# Patient Record
Sex: Female | Born: 1955 | Race: Asian | Hispanic: No | Marital: Married | Smoking: Never smoker
Health system: Southern US, Community
[De-identification: ages and names within clinical notes are randomized; demographics above are authoritative.]

## PROBLEM LIST (undated history)

## (undated) DIAGNOSIS — E7800 Pure hypercholesterolemia, unspecified: Secondary | ICD-10-CM

## (undated) DIAGNOSIS — E119 Type 2 diabetes mellitus without complications: Secondary | ICD-10-CM

## (undated) DIAGNOSIS — I10 Essential (primary) hypertension: Secondary | ICD-10-CM

---

## 2018-03-22 ENCOUNTER — Emergency Department (HOSPITAL_BASED_OUTPATIENT_CLINIC_OR_DEPARTMENT_OTHER): Payer: Commercial Managed Care - PPO

## 2018-03-22 ENCOUNTER — Inpatient Hospital Stay (HOSPITAL_BASED_OUTPATIENT_CLINIC_OR_DEPARTMENT_OTHER)
Admission: EM | Admit: 2018-03-22 | Discharge: 2018-03-24 | DRG: 854 | Disposition: A | Discharge status: Home / Self Care | Payer: Commercial Managed Care - PPO | Attending: Internal Medicine | Admitting: Internal Medicine

## 2018-03-22 ENCOUNTER — Encounter (HOSPITAL_BASED_OUTPATIENT_CLINIC_OR_DEPARTMENT_OTHER): Payer: Self-pay

## 2018-03-22 ENCOUNTER — Other Ambulatory Visit: Payer: Self-pay

## 2018-03-22 DIAGNOSIS — M542 Cervicalgia: Secondary | ICD-10-CM | POA: Diagnosis present

## 2018-03-22 DIAGNOSIS — Z7984 Long term (current) use of oral hypoglycemic drugs: Secondary | ICD-10-CM

## 2018-03-22 DIAGNOSIS — R6510 Systemic inflammatory response syndrome (SIRS) of non-infectious origin without acute organ dysfunction: Secondary | ICD-10-CM | POA: Diagnosis present

## 2018-03-22 DIAGNOSIS — K660 Peritoneal adhesions (postprocedural) (postinfection): Secondary | ICD-10-CM | POA: Diagnosis present

## 2018-03-22 DIAGNOSIS — E785 Hyperlipidemia, unspecified: Secondary | ICD-10-CM | POA: Diagnosis present

## 2018-03-22 DIAGNOSIS — A419 Sepsis, unspecified organism: Principal | ICD-10-CM | POA: Diagnosis present

## 2018-03-22 DIAGNOSIS — E1165 Type 2 diabetes mellitus with hyperglycemia: Secondary | ICD-10-CM | POA: Diagnosis present

## 2018-03-22 DIAGNOSIS — K828 Other specified diseases of gallbladder: Secondary | ICD-10-CM

## 2018-03-22 DIAGNOSIS — G8929 Other chronic pain: Secondary | ICD-10-CM | POA: Diagnosis present

## 2018-03-22 DIAGNOSIS — R109 Unspecified abdominal pain: Secondary | ICD-10-CM | POA: Diagnosis not present

## 2018-03-22 DIAGNOSIS — R1010 Upper abdominal pain, unspecified: Secondary | ICD-10-CM

## 2018-03-22 DIAGNOSIS — M79605 Pain in left leg: Secondary | ICD-10-CM | POA: Diagnosis present

## 2018-03-22 DIAGNOSIS — K819 Cholecystitis, unspecified: Secondary | ICD-10-CM | POA: Diagnosis present

## 2018-03-22 DIAGNOSIS — E1141 Type 2 diabetes mellitus with diabetic mononeuropathy: Secondary | ICD-10-CM | POA: Diagnosis present

## 2018-03-22 DIAGNOSIS — E669 Obesity, unspecified: Secondary | ICD-10-CM | POA: Diagnosis present

## 2018-03-22 DIAGNOSIS — R1013 Epigastric pain: Secondary | ICD-10-CM | POA: Diagnosis present

## 2018-03-22 DIAGNOSIS — I10 Essential (primary) hypertension: Secondary | ICD-10-CM | POA: Diagnosis present

## 2018-03-22 DIAGNOSIS — K8000 Calculus of gallbladder with acute cholecystitis without obstruction: Secondary | ICD-10-CM | POA: Diagnosis present

## 2018-03-22 DIAGNOSIS — I82441 Acute embolism and thrombosis of right tibial vein: Secondary | ICD-10-CM

## 2018-03-22 DIAGNOSIS — Z6837 Body mass index (BMI) 37.0-37.9, adult: Secondary | ICD-10-CM

## 2018-03-22 HISTORY — DX: Pure hypercholesterolemia, unspecified: E78.00

## 2018-03-22 HISTORY — DX: Essential (primary) hypertension: I10

## 2018-03-22 HISTORY — DX: Type 2 diabetes mellitus without complications: E11.9

## 2018-03-22 LAB — CBC WITH DIFFERENTIAL/PLATELET
BASOS PCT: 0 %
Basophils Absolute: 0 10*3/uL (ref 0.0–0.1)
EOS ABS: 0 10*3/uL (ref 0.0–0.7)
Eosinophils Relative: 0 %
HCT: 43.6 % (ref 36.0–46.0)
HEMOGLOBIN: 14.7 g/dL (ref 12.0–15.0)
Lymphocytes Relative: 7 %
Lymphs Abs: 1.3 10*3/uL (ref 0.7–4.0)
MCH: 25.6 pg — ABNORMAL LOW (ref 26.0–34.0)
MCHC: 33.7 g/dL (ref 30.0–36.0)
MCV: 76 fL — ABNORMAL LOW (ref 78.0–100.0)
Monocytes Absolute: 0.8 10*3/uL (ref 0.1–1.0)
Monocytes Relative: 4 %
NEUTROS PCT: 89 %
Neutro Abs: 15.7 10*3/uL — ABNORMAL HIGH (ref 1.7–7.7)
Platelets: 236 10*3/uL (ref 150–400)
RBC: 5.74 MIL/uL — AB (ref 3.87–5.11)
RDW: 13.8 % (ref 11.5–15.5)
WBC: 17.7 10*3/uL — AB (ref 4.0–10.5)

## 2018-03-22 LAB — COMPREHENSIVE METABOLIC PANEL
ALBUMIN: 3.8 g/dL (ref 3.5–5.0)
ALK PHOS: 80 U/L (ref 38–126)
ALT: 34 U/L (ref 0–44)
AST: 31 U/L (ref 15–41)
Anion gap: 12 (ref 5–15)
BUN: 11 mg/dL (ref 8–23)
CALCIUM: 8.6 mg/dL — AB (ref 8.9–10.3)
CO2: 25 mmol/L (ref 22–32)
CREATININE: 0.58 mg/dL (ref 0.44–1.00)
Chloride: 93 mmol/L — ABNORMAL LOW (ref 98–111)
GFR calc Af Amer: >60 mL/min (ref >60)
GFR calc non Af Amer: >60 mL/min (ref >60)
GLUCOSE: 255 mg/dL — AB (ref 70–99)
Potassium: 3.7 mmol/L (ref 3.5–5.1)
SODIUM: 130 mmol/L — AB (ref 135–145)
Total Bilirubin: 0.4 mg/dL (ref 0.3–1.2)
Total Protein: 7.1 g/dL (ref 6.5–8.1)

## 2018-03-22 LAB — LIPASE, BLOOD: Lipase: 24 U/L (ref 11–51)

## 2018-03-22 LAB — TROPONIN I: Troponin I: <0.03 ng/mL (ref <0.03)

## 2018-03-22 MED ORDER — METRONIDAZOLE IN NACL 5-0.79 MG/ML-% IV SOLN
500.0000 mg | Freq: Once | INTRAVENOUS | Status: AC
  Administered 2018-03-23: 500 mg via INTRAVENOUS
  Filled 2018-03-22: qty 100

## 2018-03-22 MED ORDER — IOPAMIDOL (ISOVUE-370) INJECTION 76%
100.0000 mL | Freq: Once | INTRAVENOUS | Status: AC | PRN
  Administered 2018-03-22: 100 mL via INTRAVENOUS

## 2018-03-22 MED ORDER — ALUM & MAG HYDROXIDE-SIMETH 200-200-20 MG/5ML PO SUSP
15.0000 mL | Freq: Once | ORAL | Status: AC
  Administered 2018-03-22: 15 mL via ORAL
  Filled 2018-03-22: qty 30

## 2018-03-22 MED ORDER — CEFTRIAXONE SODIUM 1 G IJ SOLR
1.0000 g | Freq: Once | INTRAMUSCULAR | Status: AC
  Administered 2018-03-23: 1 g via INTRAVENOUS
  Filled 2018-03-22: qty 10

## 2018-03-22 MED ORDER — LIDOCAINE VISCOUS HCL 2 % MT SOLN
15.0000 mL | Freq: Once | OROMUCOSAL | Status: AC
  Administered 2018-03-22: 15 mL via OROMUCOSAL
  Filled 2018-03-22: qty 15

## 2018-03-22 NOTE — ED Provider Notes (Signed)
[facility] EMERGENCY DEPARTMENT Provider Note   CSN: [id number] Arrival date & time: 03/22/18  1904     History   Chief Complaint Chief Complaint  Patient presents with  . Abdominal Pain    HPI Kathryn Christian is a 62 y.o. female.  History is being provided with translation assistance by her family.  The patient speaks primarily Urdu.  She has had upper abdominal and chest sharp stabbing pain since last evening.  She has never had this before.  It was not associated with any heartburn taste in her mouth.  It was not associate with any shortness of breath dizziness or diaphoresis.  She has more chronic pain in her neck but to do not seem to be associated.  They went to the primary care's office with another relative and had blood work and x-rays done.  It sounds like they sent her home with some metoclopramide and magnesium citrate.  She took the mag citrate at home and water and immediately vomited it and had worsening of her pain.  They brought her here for evaluation.  She has no known known history of cardiac disease.  She is been troubled by some pain in her legs over the past few months and infrequently takes NSAIDs.  The history is provided by the patient and a relative. The history is limited by a language barrier. A language interpreter was used (family).  Chest Pain   This is a new problem. The current episode started yesterday. The problem occurs constantly. The problem has not changed since onset.The pain is present in the substernal region. The pain is moderate. The quality of the pain is described as sharp. The pain does not radiate. Associated symptoms include abdominal pain, headaches, nausea and vomiting. Pertinent negatives include no fever and no shortness of breath. She has tried nothing for the symptoms. The treatment provided no relief.  Her past medical history is significant for diabetes, hyperlipidemia and hypertension.  Pertinent negatives for past medical  history include no CAD.    Past Medical History:  Diagnosis Date  . Diabetes mellitus without complication (HCC)   . High cholesterol   . Hypertension     There are no active problems to display for this patient.   History reviewed. No pertinent surgical history.   OB History   None      Home Medications    Prior to Admission medications   Not on File    Family History No family history on file.  Social History Social History   Tobacco Use  . Smoking status: Never Smoker  . Smokeless tobacco: Never Used  Substance Use Topics  . Alcohol use: Never    Frequency: Never  . Drug use: Never     Allergies   Patient has no known allergies.   Review of Systems Review of Systems  Constitutional: Negative for fever.  HENT: Negative for sore throat.   Eyes: Negative for visual disturbance.  Respiratory: Negative for shortness of breath.   Cardiovascular: Positive for chest pain.  Gastrointestinal: Positive for abdominal pain, nausea and vomiting. Negative for diarrhea.  Genitourinary: Negative for dysuria and frequency.  Musculoskeletal: Positive for myalgias and neck pain.  Skin: Negative for rash.  Neurological: Positive for headaches.     Physical Exam Updated Vital Signs BP (!) 167/83 (BP Location: Left Arm)   Pulse 80   Temp 98.9 F (37.2 C) (Oral)   Resp 20   Ht 5\' 1"  (1.549 m)  Wt 90.3 kg   SpO2 98%   BMI 37.60 kg/m   Physical Exam  Constitutional: She appears well-developed and well-nourished. No distress.  HENT:  Head: Normocephalic and atraumatic.  Eyes: Conjunctivae are normal.  Neck: Neck supple.  Cardiovascular: Normal rate, regular rhythm, normal heart sounds and intact distal pulses.  No murmur heard. Pulmonary/Chest: Effort normal and breath sounds normal. No respiratory distress.  Abdominal: Soft. There is tenderness (mild subxiphoid). There is no rigidity and no guarding.  Musculoskeletal: She exhibits edema (1+ bilateral)  and tenderness (lle). She exhibits no deformity.  Neurological: She is alert.  Skin: Skin is warm and dry.  Psychiatric: She has a normal mood and affect.  Nursing note and vitals reviewed.    ED Treatments / Results  Labs (all labs ordered are listed, but only abnormal results are displayed) Labs Reviewed  CBC WITH DIFFERENTIAL/PLATELET - Abnormal; Notable for the following components:      Result Value   WBC 17.7 (*)    RBC 5.74 (*)    MCV 76.0 (*)    MCH 25.6 (*)    Neutro Abs 15.7 (*)    All other components within normal limits  COMPREHENSIVE METABOLIC PANEL - Abnormal; Notable for the following components:   Sodium 130 (*)    Chloride 93 (*)    Glucose, Bld 255 (*)    Calcium 8.6 (*)    All other components within normal limits  TROPONIN I  LIPASE, BLOOD    EKG EKG Interpretation  Date/Time:  Wednesday March 22 2018 20:29:41 EDT Ventricular Rate:  82 PR Interval:  162 QRS Duration: 99 QT Interval:  387 QTC Calculation: 452 R Axis:   85 Text Interpretation:  Sinus rhythm Borderline right axis deviation Confirmed by Meridee ScoreButler, Trishna Cwik [phone]([id number]) on 03/22/2018 9:01:18 PM   Radiology Dg Chest 2 View  Result Date: 03/22/2018 CLINICAL DATA:  Chest pain EXAM: CHEST - 2 VIEW COMPARISON:  None. FINDINGS: Mildly low lung volume. No acute airspace disease or effusion. Normal heart size. No pneumothorax. IMPRESSION: No active cardiopulmonary disease.  Low lung volume. Electronically Signed   By: Jasmine PangKim  Fujinaga M.D.   On: 03/22/2018 21:36   Ct Angio Chest Pe W/cm &/or Wo Cm  Result Date: 03/22/2018 CLINICAL DATA:  Epigastric pain for 15 hours. Mid chest pain. Elevated white cell count. EXAM: CT ANGIOGRAPHY CHEST CT ABDOMEN AND PELVIS WITH CONTRAST TECHNIQUE: Multidetector CT imaging of the chest was performed using the standard protocol during bolus administration of intravenous contrast. Multiplanar CT image reconstructions and MIPs were obtained to evaluate the vascular anatomy.  Multidetector CT imaging of the abdomen and pelvis was performed using the standard protocol during bolus administration of intravenous contrast. CONTRAST:  100mL ISOVUE-370 IOPAMIDOL (ISOVUE-370) INJECTION 76% COMPARISON:  None. FINDINGS: CTA CHEST FINDINGS Cardiovascular: Motion artifact limits examination. There is good opacification of the central and segmental pulmonary arteries. No focal filling defects. No evidence of significant pulmonary embolus. Normal heart size. No pericardial effusion. Normal caliber thoracic aorta. No aortic dissection. Great vessel origins are patent. Mediastinum/Nodes: No significant lymphadenopathy in the chest. Esophagus is decompressed. No abnormal mediastinal gas or fluid collections. Lungs/Pleura: Motion artifact limits examination. There is no evidence of focal consolidation or significant airspace disease. No pleural effusions. No pneumothorax. Airways are patent. Musculoskeletal: Degenerative changes in the spine. No destructive bone lesions. Review of the MIP images confirms the above findings. CT ABDOMEN and PELVIS FINDINGS Hepatobiliary: No focal liver lesions. Homogeneous liver parenchymal pattern. The gallbladder  is mildly distended and there is suggestion of pericholecystic edema. No stones are identified but changes may represent acute cholecystitis. No bile duct dilatation. Pancreas: Unremarkable. No pancreatic ductal dilatation or surrounding inflammatory changes. Spleen: Normal in size without focal abnormality. Adrenals/Urinary Tract: No adrenal gland nodules. Renal nephrograms are symmetrical. Small cyst on the left kidney. No hydronephrosis or hydroureter. Bladder is unremarkable. Stomach/Bowel: Stomach, small bowel, and colon are not abnormally distended. Scattered stool throughout the colon. No wall thickening is appreciated. Appendix is normal. Vascular/Lymphatic: No significant vascular findings are present. No enlarged abdominal or pelvic lymph nodes.  Reproductive: Uterus and bilateral adnexa are unremarkable. Other: No abdominal wall hernia or abnormality. No abdominopelvic ascites. Musculoskeletal: No acute or significant osseous findings. Review of the MIP images confirms the above findings. IMPRESSION: 1. No evidence of significant pulmonary embolus. 2. No evidence of active pulmonary disease. 3. Distended gallbladder with suggestion of pericholecystic edema. This could indicate acute cholecystitis. Consider ultrasound for further evaluation. 4. No evidence of bowel obstruction or inflammation. Electronically Signed   By: Burman NievesWilliam  Stevens M.D.   On: 03/22/2018 23:36   Ct Abdomen Pelvis W Contrast  Result Date: 03/22/2018 CLINICAL DATA:  Epigastric pain for 15 hours. Mid chest pain. Elevated white cell count. EXAM: CT ANGIOGRAPHY CHEST CT ABDOMEN AND PELVIS WITH CONTRAST TECHNIQUE: Multidetector CT imaging of the chest was performed using the standard protocol during bolus administration of intravenous contrast. Multiplanar CT image reconstructions and MIPs were obtained to evaluate the vascular anatomy. Multidetector CT imaging of the abdomen and pelvis was performed using the standard protocol during bolus administration of intravenous contrast. CONTRAST:  100mL ISOVUE-370 IOPAMIDOL (ISOVUE-370) INJECTION 76% COMPARISON:  None. FINDINGS: CTA CHEST FINDINGS Cardiovascular: Motion artifact limits examination. There is good opacification of the central and segmental pulmonary arteries. No focal filling defects. No evidence of significant pulmonary embolus. Normal heart size. No pericardial effusion. Normal caliber thoracic aorta. No aortic dissection. Great vessel origins are patent. Mediastinum/Nodes: No significant lymphadenopathy in the chest. Esophagus is decompressed. No abnormal mediastinal gas or fluid collections. Lungs/Pleura: Motion artifact limits examination. There is no evidence of focal consolidation or significant airspace disease. No pleural  effusions. No pneumothorax. Airways are patent. Musculoskeletal: Degenerative changes in the spine. No destructive bone lesions. Review of the MIP images confirms the above findings. CT ABDOMEN and PELVIS FINDINGS Hepatobiliary: No focal liver lesions. Homogeneous liver parenchymal pattern. The gallbladder is mildly distended and there is suggestion of pericholecystic edema. No stones are identified but changes may represent acute cholecystitis. No bile duct dilatation. Pancreas: Unremarkable. No pancreatic ductal dilatation or surrounding inflammatory changes. Spleen: Normal in size without focal abnormality. Adrenals/Urinary Tract: No adrenal gland nodules. Renal nephrograms are symmetrical. Small cyst on the left kidney. No hydronephrosis or hydroureter. Bladder is unremarkable. Stomach/Bowel: Stomach, small bowel, and colon are not abnormally distended. Scattered stool throughout the colon. No wall thickening is appreciated. Appendix is normal. Vascular/Lymphatic: No significant vascular findings are present. No enlarged abdominal or pelvic lymph nodes. Reproductive: Uterus and bilateral adnexa are unremarkable. Other: No abdominal wall hernia or abnormality. No abdominopelvic ascites. Musculoskeletal: No acute or significant osseous findings. Review of the MIP images confirms the above findings. IMPRESSION: 1. No evidence of significant pulmonary embolus. 2. No evidence of active pulmonary disease. 3. Distended gallbladder with suggestion of pericholecystic edema. This could indicate acute cholecystitis. Consider ultrasound for further evaluation. 4. No evidence of bowel obstruction or inflammation. Electronically Signed   By: Marisa CyphersWilliam  Stevens M.D.  On: 03/22/2018 23:36    Procedures .Critical Care Performed by: Terrilee Files, MD Authorized by: Terrilee Files, MD   Critical care provider statement:    Critical care time (minutes):  45   Critical care was necessary to treat or prevent imminent  or life-threatening deterioration of the following conditions:  Sepsis   Critical care was time spent personally by me on the following activities:  Discussions with consultants, evaluation of patient's response to treatment, examination of patient, ordering and performing treatments and interventions, ordering and review of laboratory studies, ordering and review of radiographic studies, pulse oximetry, re-evaluation of patient's condition, obtaining history from patient or surrogate, review of old charts and development of treatment plan with patient or surrogate   I assumed direction of critical care for this patient from another provider in my specialty: no     (including critical care time)  Medications Ordered in ED Medications  lidocaine (XYLOCAINE) 2 % viscous mouth solution 15 mL (15 mLs Mouth/Throat Given 03/22/18 2217)  alum & mag hydroxide-simeth (MAALOX/MYLANTA) 200-200-20 MG/5ML suspension 15 mL (15 mLs Oral Given 03/22/18 2218)  iopamidol (ISOVUE-370) 76 % injection 100 mL (100 mLs Intravenous Contrast Given 03/22/18 2304)     Initial Impression / Assessment and Plan / ED Course  I have reviewed the triage vital signs and the nursing notes.  Pertinent labs & imaging results that were available during my care of the patient were reviewed by me and considered in my medical decision making (see chart for details).  Clinical Course as of Mar 24 1341  Wed Mar 22, 2018  2341 Patient symptoms improved here.  I ordered her a CT chest PE study and a CT abdomen.  There was no evidence of PE but on the abdomen they did see an enlarged gallbladder with question pericholecystic edema.  Ultrasound is not available at this time.  She does have an elevated white count but normal LFTs.   [MB]  2359 I consulted general surgery Dr. Carolynne Edouard.  He recommends the patient be admitted to medical service on IV antibiotics and they will evaluate her and she needs percutaneous drainage versus surgery.  I have  paged the hospitalist for admission.   [MB]  Thu Mar 23, 2018  0020 Discussed with Dr. Katrinka Blazing hospitalist triad at [facility] who accepts the patient for admission.   [MB]    Clinical Course User Index [MB] Terrilee Files, MD     Final Clinical Impressions(s) / ED Diagnoses   Final diagnoses:  Upper abdominal pain  Gallbladder dilatation  SIRS (systemic inflammatory response syndrome) [facility])    ED Discharge Orders    None       Terrilee Files, MD 03/24/18 1344

## 2018-03-22 NOTE — ED Triage Notes (Addendum)
Per son/interpreter-pt with epigastric pain started approx 7am-[facility] EKG and abd series-rx reglan and magnesium citrate-to triage in w/c-NAD

## 2018-03-22 NOTE — ED Notes (Signed)
Pt reports pain is unchanged after GI cocktail

## 2018-03-22 NOTE — ED Notes (Signed)
Per family, pt sts her pain has been in central chest, not abdomen.

## 2018-03-22 NOTE — ED Notes (Signed)
Patient transported to X-ray 

## 2018-03-22 NOTE — ED Notes (Signed)
Patient transported to CT 

## 2018-03-23 ENCOUNTER — Inpatient Hospital Stay (HOSPITAL_COMMUNITY): Payer: Commercial Managed Care - PPO

## 2018-03-23 ENCOUNTER — Encounter (HOSPITAL_COMMUNITY): Payer: Self-pay | Admitting: Internal Medicine

## 2018-03-23 ENCOUNTER — Encounter (HOSPITAL_COMMUNITY)
Admission: EM | Disposition: A | Discharge status: Home / Self Care | Payer: Self-pay | Source: Home / Self Care | Attending: Internal Medicine

## 2018-03-23 ENCOUNTER — Inpatient Hospital Stay (HOSPITAL_COMMUNITY): Payer: Commercial Managed Care - PPO | Admitting: Anesthesiology

## 2018-03-23 DIAGNOSIS — E1141 Type 2 diabetes mellitus with diabetic mononeuropathy: Secondary | ICD-10-CM | POA: Diagnosis present

## 2018-03-23 DIAGNOSIS — I82402 Acute embolism and thrombosis of unspecified deep veins of left lower extremity: Secondary | ICD-10-CM | POA: Diagnosis not present

## 2018-03-23 DIAGNOSIS — K828 Other specified diseases of gallbladder: Secondary | ICD-10-CM | POA: Diagnosis not present

## 2018-03-23 DIAGNOSIS — M79605 Pain in left leg: Secondary | ICD-10-CM | POA: Diagnosis not present

## 2018-03-23 DIAGNOSIS — A419 Sepsis, unspecified organism: Secondary | ICD-10-CM | POA: Diagnosis present

## 2018-03-23 DIAGNOSIS — R1010 Upper abdominal pain, unspecified: Secondary | ICD-10-CM | POA: Diagnosis not present

## 2018-03-23 DIAGNOSIS — K819 Cholecystitis, unspecified: Secondary | ICD-10-CM | POA: Diagnosis present

## 2018-03-23 DIAGNOSIS — Z6837 Body mass index (BMI) 37.0-37.9, adult: Secondary | ICD-10-CM | POA: Diagnosis not present

## 2018-03-23 DIAGNOSIS — K660 Peritoneal adhesions (postprocedural) (postinfection): Secondary | ICD-10-CM | POA: Diagnosis present

## 2018-03-23 DIAGNOSIS — M79609 Pain in unspecified limb: Secondary | ICD-10-CM | POA: Diagnosis not present

## 2018-03-23 DIAGNOSIS — R1013 Epigastric pain: Secondary | ICD-10-CM | POA: Diagnosis present

## 2018-03-23 DIAGNOSIS — I82441 Acute embolism and thrombosis of right tibial vein: Secondary | ICD-10-CM | POA: Diagnosis not present

## 2018-03-23 DIAGNOSIS — E785 Hyperlipidemia, unspecified: Secondary | ICD-10-CM | POA: Diagnosis present

## 2018-03-23 DIAGNOSIS — E669 Obesity, unspecified: Secondary | ICD-10-CM | POA: Diagnosis present

## 2018-03-23 DIAGNOSIS — K8000 Calculus of gallbladder with acute cholecystitis without obstruction: Secondary | ICD-10-CM | POA: Diagnosis present

## 2018-03-23 DIAGNOSIS — R6510 Systemic inflammatory response syndrome (SIRS) of non-infectious origin without acute organ dysfunction: Secondary | ICD-10-CM | POA: Diagnosis not present

## 2018-03-23 DIAGNOSIS — E1165 Type 2 diabetes mellitus with hyperglycemia: Secondary | ICD-10-CM | POA: Diagnosis present

## 2018-03-23 DIAGNOSIS — R109 Unspecified abdominal pain: Secondary | ICD-10-CM | POA: Diagnosis present

## 2018-03-23 DIAGNOSIS — I10 Essential (primary) hypertension: Secondary | ICD-10-CM | POA: Diagnosis present

## 2018-03-23 DIAGNOSIS — Z7984 Long term (current) use of oral hypoglycemic drugs: Secondary | ICD-10-CM | POA: Diagnosis not present

## 2018-03-23 DIAGNOSIS — M542 Cervicalgia: Secondary | ICD-10-CM | POA: Diagnosis present

## 2018-03-23 DIAGNOSIS — G8929 Other chronic pain: Secondary | ICD-10-CM | POA: Diagnosis present

## 2018-03-23 HISTORY — PX: CHOLECYSTECTOMY: SHX55

## 2018-03-23 LAB — GLUCOSE, CAPILLARY
GLUCOSE-CAPILLARY: 194 mg/dL — AB (ref 70–99)
Glucose-Capillary: 253 mg/dL — ABNORMAL HIGH (ref 70–99)
Glucose-Capillary: 253 mg/dL — ABNORMAL HIGH (ref 70–99)
Glucose-Capillary: 260 mg/dL — ABNORMAL HIGH (ref 70–99)
Glucose-Capillary: 258 mg/dL — ABNORMAL HIGH (ref 70–99)
Glucose-Capillary: 239 mg/dL — ABNORMAL HIGH (ref 70–99)

## 2018-03-23 LAB — COMPREHENSIVE METABOLIC PANEL
ALBUMIN: 3.2 g/dL — AB (ref 3.5–5.0)
ALT: 30 U/L (ref 0–44)
ANION GAP: 11 (ref 5–15)
AST: 25 U/L (ref 15–41)
Alkaline Phosphatase: 71 U/L (ref 38–126)
BUN: 9 mg/dL (ref 8–23)
CHLORIDE: 97 mmol/L — AB (ref 98–111)
CO2: 29 mmol/L (ref 22–32)
Calcium: 8.5 mg/dL — ABNORMAL LOW (ref 8.9–10.3)
Creatinine, Ser: 0.58 mg/dL (ref 0.44–1.00)
GFR calc Af Amer: >60 mL/min (ref >60)
GFR calc non Af Amer: >60 mL/min (ref >60)
GLUCOSE: 260 mg/dL — AB (ref 70–99)
POTASSIUM: 3.8 mmol/L (ref 3.5–5.1)
SODIUM: 137 mmol/L (ref 135–145)
Total Bilirubin: 0.6 mg/dL (ref 0.3–1.2)
Total Protein: 6.5 g/dL (ref 6.5–8.1)

## 2018-03-23 LAB — URINALYSIS, ROUTINE W REFLEX MICROSCOPIC
BILIRUBIN URINE: NEGATIVE
GLUCOSE, UA: 250 mg/dL — AB
Hgb urine dipstick: NEGATIVE
Ketones, ur: NEGATIVE mg/dL
Leukocytes, UA: NEGATIVE
Nitrite: NEGATIVE
PH: 7 (ref 5.0–8.0)
Protein, ur: NEGATIVE mg/dL

## 2018-03-23 LAB — LACTIC ACID, PLASMA
Lactic Acid, Venous: 1.5 mmol/L (ref 0.5–1.9)
Lactic Acid, Venous: 1.3 mmol/L (ref 0.5–1.9)

## 2018-03-23 LAB — CBC
HCT: 41.4 % (ref 36.0–46.0)
HEMOGLOBIN: 13.8 g/dL (ref 12.0–15.0)
MCH: 25.7 pg — ABNORMAL LOW (ref 26.0–34.0)
MCHC: 33.3 g/dL (ref 30.0–36.0)
MCV: 77.1 fL — ABNORMAL LOW (ref 78.0–100.0)
Platelets: 233 10*3/uL (ref 150–400)
RBC: 5.37 MIL/uL — ABNORMAL HIGH (ref 3.87–5.11)
RDW: 13.8 % (ref 11.5–15.5)
WBC: 13.9 10*3/uL — ABNORMAL HIGH (ref 4.0–10.5)

## 2018-03-23 LAB — SURGICAL PCR SCREEN
MRSA, PCR: NEGATIVE
Staphylococcus aureus: NEGATIVE

## 2018-03-23 LAB — HIV ANTIBODY (ROUTINE TESTING W REFLEX): HIV SCREEN 4TH GENERATION: NONREACTIVE

## 2018-03-23 LAB — D-DIMER, QUANTITATIVE (NOT AT ARMC): D DIMER QUANT: 0.69 ug{FEU}/mL — AB (ref 0.00–0.50)

## 2018-03-23 SURGERY — LAPAROSCOPIC CHOLECYSTECTOMY WITH INTRAOPERATIVE CHOLANGIOGRAM
Anesthesia: General | Site: Abdomen

## 2018-03-23 MED ORDER — ACETAMINOPHEN 10 MG/ML IV SOLN
INTRAVENOUS | Status: DC | PRN
  Administered 2018-03-23: 1000 mg via INTRAVENOUS

## 2018-03-23 MED ORDER — SODIUM CHLORIDE 0.9 % IV SOLN
INTRAVENOUS | Status: DC | PRN
  Administered 2018-03-23: 16 mL

## 2018-03-23 MED ORDER — INSULIN ASPART 100 UNIT/ML ~~LOC~~ SOLN
0.0000 [IU] | SUBCUTANEOUS | Status: DC
  Administered 2018-03-23: 5 [IU] via SUBCUTANEOUS

## 2018-03-23 MED ORDER — PROPOFOL 10 MG/ML IV BOLUS
INTRAVENOUS | Status: AC
  Filled 2018-03-23: qty 20

## 2018-03-23 MED ORDER — SUGAMMADEX SODIUM 200 MG/2ML IV SOLN
INTRAVENOUS | Status: DC | PRN
  Administered 2018-03-23: 200 mg via INTRAVENOUS

## 2018-03-23 MED ORDER — PHENYLEPHRINE 40 MCG/ML (10ML) SYRINGE FOR IV PUSH (FOR BLOOD PRESSURE SUPPORT)
PREFILLED_SYRINGE | INTRAVENOUS | Status: DC | PRN
  Administered 2018-03-23: 80 ug via INTRAVENOUS

## 2018-03-23 MED ORDER — KETOROLAC TROMETHAMINE 30 MG/ML IJ SOLN: 30.0000 mg | Freq: Once | INTRAMUSCULAR | Status: DC | PRN

## 2018-03-23 MED ORDER — LACTATED RINGERS IR SOLN
Status: DC | PRN
  Administered 2018-03-23: 1000 mL

## 2018-03-23 MED ORDER — INSULIN ASPART 100 UNIT/ML ~~LOC~~ SOLN
5.0000 [IU] | Freq: Once | SUBCUTANEOUS | Status: AC
  Administered 2018-03-23: 5 [IU] via SUBCUTANEOUS

## 2018-03-23 MED ORDER — ACETAMINOPHEN 10 MG/ML IV SOLN
INTRAVENOUS | Status: AC
  Filled 2018-03-23: qty 100

## 2018-03-23 MED ORDER — ACETAMINOPHEN 325 MG PO TABS: 650.0000 mg | ORAL_TABLET | Freq: Four times a day (QID) | ORAL | Status: DC | PRN

## 2018-03-23 MED ORDER — MORPHINE SULFATE (PF) 2 MG/ML IV SOLN
2.0000 mg | INTRAVENOUS | Status: DC | PRN
  Administered 2018-03-23: 2 mg via INTRAVENOUS
  Filled 2018-03-23: qty 1

## 2018-03-23 MED ORDER — HYDROMORPHONE HCL 1 MG/ML IJ SOLN: 0.2500 mg | INTRAMUSCULAR | Status: DC | PRN

## 2018-03-23 MED ORDER — ONDANSETRON 4 MG PO TBDP: 4.0000 mg | ORAL_TABLET | Freq: Four times a day (QID) | ORAL | Status: DC | PRN

## 2018-03-23 MED ORDER — TRAMADOL HCL 50 MG PO TABS: 50.0000 mg | ORAL_TABLET | Freq: Four times a day (QID) | ORAL | Status: DC | PRN

## 2018-03-23 MED ORDER — MEPERIDINE HCL 50 MG/ML IJ SOLN: 6.2500 mg | INTRAMUSCULAR | Status: DC | PRN

## 2018-03-23 MED ORDER — HYDROCODONE-ACETAMINOPHEN 5-325 MG PO TABS
1.0000 | ORAL_TABLET | ORAL | Status: DC | PRN
  Administered 2018-03-23: 1 via ORAL
  Filled 2018-03-23: qty 1

## 2018-03-23 MED ORDER — BUPIVACAINE-EPINEPHRINE (PF) 0.5% -1:200000 IJ SOLN
INTRAMUSCULAR | Status: AC
  Filled 2018-03-23: qty 30

## 2018-03-23 MED ORDER — ROCURONIUM BROMIDE 10 MG/ML (PF) SYRINGE
PREFILLED_SYRINGE | INTRAVENOUS | Status: DC | PRN
  Administered 2018-03-23: 50 mg via INTRAVENOUS

## 2018-03-23 MED ORDER — SODIUM CHLORIDE 0.9 % IV SOLN
INTRAVENOUS | Status: DC | PRN
  Administered 2018-03-23: via INTRAVENOUS

## 2018-03-23 MED ORDER — FENTANYL CITRATE (PF) 250 MCG/5ML IJ SOLN
INTRAMUSCULAR | Status: DC | PRN
  Administered 2018-03-23: 150 ug via INTRAVENOUS

## 2018-03-23 MED ORDER — MIDAZOLAM HCL 2 MG/2ML IJ SOLN
INTRAMUSCULAR | Status: AC
  Filled 2018-03-23: qty 2

## 2018-03-23 MED ORDER — PROPOFOL 10 MG/ML IV BOLUS
INTRAVENOUS | Status: DC | PRN
  Administered 2018-03-23: 140 mg via INTRAVENOUS

## 2018-03-23 MED ORDER — BUPIVACAINE-EPINEPHRINE 0.5% -1:200000 IJ SOLN
INTRAMUSCULAR | Status: DC | PRN
  Administered 2018-03-23: 30 mL

## 2018-03-23 MED ORDER — LACTATED RINGERS IV SOLN
INTRAVENOUS | Status: DC
  Administered 2018-03-23: 1000 mL via INTRAVENOUS
  Administered 2018-03-23: 14:00:00 via INTRAVENOUS

## 2018-03-23 MED ORDER — ONDANSETRON HCL 4 MG PO TABS: 4.0000 mg | ORAL_TABLET | Freq: Four times a day (QID) | ORAL | Status: DC | PRN

## 2018-03-23 MED ORDER — POTASSIUM CHLORIDE IN NACL 20-0.9 MEQ/L-% IV SOLN
INTRAVENOUS | Status: DC
  Administered 2018-03-23: 17:00:00 via INTRAVENOUS
  Filled 2018-03-23 (×2): qty 1000

## 2018-03-23 MED ORDER — SODIUM CHLORIDE 0.9 % IV SOLN
2.0000 g | INTRAVENOUS | Status: DC
  Filled 2018-03-23: qty 20

## 2018-03-23 MED ORDER — ONDANSETRON HCL 4 MG/2ML IJ SOLN: 4.0000 mg | Freq: Four times a day (QID) | INTRAMUSCULAR | Status: DC | PRN

## 2018-03-23 MED ORDER — ACETAMINOPHEN 650 MG RE SUPP: 650.0000 mg | Freq: Four times a day (QID) | RECTAL | Status: DC | PRN

## 2018-03-23 MED ORDER — METRONIDAZOLE IN NACL 5-0.79 MG/ML-% IV SOLN
500.0000 mg | Freq: Three times a day (TID) | INTRAVENOUS | Status: DC
  Administered 2018-03-23: 500 mg via INTRAVENOUS
  Filled 2018-03-23: qty 100

## 2018-03-23 MED ORDER — INSULIN ASPART 100 UNIT/ML ~~LOC~~ SOLN
0.0000 [IU] | Freq: Every day | SUBCUTANEOUS | Status: DC
  Administered 2018-03-23: 2 [IU] via SUBCUTANEOUS

## 2018-03-23 MED ORDER — SODIUM CHLORIDE 0.9 % IV SOLN
INTRAVENOUS | Status: DC
  Administered 2018-03-23: 03:00:00 via INTRAVENOUS

## 2018-03-23 MED ORDER — PROMETHAZINE HCL 25 MG/ML IJ SOLN: 6.2500 mg | INTRAMUSCULAR | Status: DC | PRN

## 2018-03-23 MED ORDER — IOPAMIDOL (ISOVUE-300) INJECTION 61%
INTRAVENOUS | Status: AC
  Filled 2018-03-23: qty 50

## 2018-03-23 MED ORDER — HYDROCHLOROTHIAZIDE 25 MG PO TABS
25.0000 mg | ORAL_TABLET | Freq: Every day | ORAL | Status: DC
  Administered 2018-03-23 – 2018-03-24 (×2): 25 mg via ORAL
  Filled 2018-03-23 (×2): qty 1

## 2018-03-23 MED ORDER — ENOXAPARIN SODIUM 40 MG/0.4ML ~~LOC~~ SOLN
40.0000 mg | SUBCUTANEOUS | Status: DC
  Filled 2018-03-23: qty 0.4

## 2018-03-23 MED ORDER — ONDANSETRON HCL 4 MG/2ML IJ SOLN
INTRAMUSCULAR | Status: AC
  Filled 2018-03-23: qty 2

## 2018-03-23 MED ORDER — DEXAMETHASONE SODIUM PHOSPHATE 10 MG/ML IJ SOLN
INTRAMUSCULAR | Status: AC
  Filled 2018-03-23: qty 1

## 2018-03-23 MED ORDER — DEXAMETHASONE SODIUM PHOSPHATE 10 MG/ML IJ SOLN
INTRAMUSCULAR | Status: DC | PRN
  Administered 2018-03-23: 10 mg via INTRAVENOUS

## 2018-03-23 MED ORDER — HYDROMORPHONE HCL 1 MG/ML IJ SOLN: 1.0000 mg | INTRAMUSCULAR | Status: DC | PRN

## 2018-03-23 MED ORDER — CEFTRIAXONE SODIUM 2 G IJ SOLR
2.0000 g | INTRAMUSCULAR | Status: DC
  Administered 2018-03-23: 2 g via INTRAVENOUS
  Filled 2018-03-23: qty 2

## 2018-03-23 MED ORDER — ROCURONIUM BROMIDE 10 MG/ML (PF) SYRINGE
PREFILLED_SYRINGE | INTRAVENOUS | Status: AC
  Filled 2018-03-23: qty 10

## 2018-03-23 MED ORDER — ONDANSETRON HCL 4 MG/2ML IJ SOLN
INTRAMUSCULAR | Status: DC | PRN
  Administered 2018-03-23: 4 mg via INTRAVENOUS

## 2018-03-23 MED ORDER — LIDOCAINE 2% (20 MG/ML) 5 ML SYRINGE
INTRAMUSCULAR | Status: DC | PRN
  Administered 2018-03-23: 100 mg via INTRAVENOUS

## 2018-03-23 MED ORDER — INSULIN ASPART 100 UNIT/ML ~~LOC~~ SOLN
0.0000 [IU] | Freq: Three times a day (TID) | SUBCUTANEOUS | Status: DC
  Administered 2018-03-23: 5 [IU] via SUBCUTANEOUS
  Administered 2018-03-24: 7 [IU] via SUBCUTANEOUS
  Administered 2018-03-24: 3 [IU] via SUBCUTANEOUS

## 2018-03-23 MED ORDER — SODIUM CHLORIDE 0.9% FLUSH: 3.0000 mL | Freq: Two times a day (BID) | INTRAVENOUS | Status: DC

## 2018-03-23 MED ORDER — IOPAMIDOL (ISOVUE-300) INJECTION 61%: INTRAVENOUS | Status: DC | PRN

## 2018-03-23 MED ORDER — SUGAMMADEX SODIUM 200 MG/2ML IV SOLN
INTRAVENOUS | Status: AC
  Filled 2018-03-23: qty 2

## 2018-03-23 MED ORDER — ACETAMINOPHEN 325 MG PO TABS
650.0000 mg | ORAL_TABLET | Freq: Once | ORAL | Status: AC
  Administered 2018-03-23: 650 mg via ORAL
  Filled 2018-03-23: qty 2

## 2018-03-23 MED ORDER — LIDOCAINE 2% (20 MG/ML) 5 ML SYRINGE
INTRAMUSCULAR | Status: AC
  Filled 2018-03-23: qty 5

## 2018-03-23 MED ORDER — FENTANYL CITRATE (PF) 250 MCG/5ML IJ SOLN
INTRAMUSCULAR | Status: AC
  Filled 2018-03-23: qty 5

## 2018-03-23 SURGICAL SUPPLY — 31 items
APPLIER CLIP ROT 10 11.4 M/L (STAPLE) ×3
CABLE HIGH FREQUENCY MONO STRZ (ELECTRODE) ×3 IMPLANT
CHLORAPREP W/TINT 26ML (MISCELLANEOUS) ×3 IMPLANT
CLIP APPLIE ROT 10 11.4 M/L (STAPLE) ×1 IMPLANT
CLOSURE WOUND 1/2 X4 (GAUZE/BANDAGES/DRESSINGS) ×1
COVER MAYO STAND STRL (DRAPES) ×3 IMPLANT
COVER SURGICAL LIGHT HANDLE (MISCELLANEOUS) ×3 IMPLANT
DECANTER SPIKE VIAL GLASS SM (MISCELLANEOUS) ×3 IMPLANT
DRAPE C-ARM 42X120 X-RAY (DRAPES) ×3 IMPLANT
ELECT REM PT RETURN 15FT ADLT (MISCELLANEOUS) ×3 IMPLANT
GAUZE SPONGE 2X2 8PLY STRL LF (GAUZE/BANDAGES/DRESSINGS) ×1 IMPLANT
GLOVE SURG ORTHO 8.0 STRL STRW (GLOVE) ×3 IMPLANT
GOWN STRL REUS W/TWL XL LVL3 (GOWN DISPOSABLE) ×6 IMPLANT
HEMOSTAT SURGICEL 4X8 (HEMOSTASIS) IMPLANT
KIT BASIN OR (CUSTOM PROCEDURE TRAY) ×3 IMPLANT
POUCH SPECIMEN RETRIEVAL 10MM (ENDOMECHANICALS) ×3 IMPLANT
SCISSORS LAP 5X35 DISP (ENDOMECHANICALS) ×3 IMPLANT
SET CHOLANGIOGRAPH MIX (MISCELLANEOUS) ×3 IMPLANT
SET IRRIG TUBING LAPAROSCOPIC (IRRIGATION / IRRIGATOR) ×3 IMPLANT
SLEEVE XCEL OPT CAN 5 100 (ENDOMECHANICALS) ×3 IMPLANT
SPONGE GAUZE 2X2 STER 10/PKG (GAUZE/BANDAGES/DRESSINGS) ×2
STRIP CLOSURE SKIN 1/2X4 (GAUZE/BANDAGES/DRESSINGS) ×2 IMPLANT
SUT MNCRL AB 4-0 PS2 18 (SUTURE) ×6 IMPLANT
TAPE CLOTH SURG 4X10 WHT LF (GAUZE/BANDAGES/DRESSINGS) ×3 IMPLANT
TOWEL OR 17X26 10 PK STRL BLUE (TOWEL DISPOSABLE) ×3 IMPLANT
TOWEL OR NON WOVEN STRL DISP B (DISPOSABLE) ×3 IMPLANT
TRAY LAPAROSCOPIC (CUSTOM PROCEDURE TRAY) ×3 IMPLANT
TROCAR BLADELESS OPT 5 100 (ENDOMECHANICALS) ×3 IMPLANT
TROCAR XCEL BLUNT TIP 100MML (ENDOMECHANICALS) ×3 IMPLANT
TROCAR XCEL NON-BLD 11X100MML (ENDOMECHANICALS) ×3 IMPLANT
TUBING INSUF HEATED (TUBING) IMPLANT

## 2018-03-23 NOTE — Anesthesia Preprocedure Evaluation (Addendum)
Anesthesia Evaluation  Patient identified by MRN, date of birth, ID band Patient awake    Reviewed: Allergy & Precautions, NPO status , Patient's Chart, lab work & pertinent test results  Airway Mallampati: II  TM Distance: >3 FB Neck ROM: Full    Dental no notable dental hx. (+) Teeth Intact   Pulmonary    Pulmonary exam normal breath sounds clear to auscultation       Cardiovascular hypertension, Pt. on medications Normal cardiovascular exam Rhythm:Regular Rate:Normal     Neuro/Psych negative neurological ROS  negative psych ROS   GI/Hepatic negative GI ROS, Neg liver ROS,   Endo/Other  diabetes, Type 2, Oral Hypoglycemic Agents  Renal/GU negative Renal ROS     Musculoskeletal negative musculoskeletal ROS (+)   Abdominal Normal abdominal exam  (+) + obese,   Peds  Hematology negative hematology ROS (+)   Anesthesia Other Findings   Reproductive/Obstetrics negative OB ROS                            Anesthesia Physical Anesthesia Plan  ASA: II  Anesthesia Plan: General   Post-op Pain Management:    Induction: Intravenous  PONV Risk Score and Plan:   Airway Management Planned: Oral ETT  Additional Equipment:   Intra-op Plan:   Post-operative Plan: Extubation in OR  Informed Consent: I have reviewed the patients History and Physical, chart, labs and discussed the procedure including the risks, benefits and alternatives for the proposed anesthesia with the patient or authorized representative who has indicated his/her understanding and acceptance.   Dental advisory given  Plan Discussed with: CRNA and Surgeon  Anesthesia Plan Comments:         Anesthesia Quick Evaluation

## 2018-03-23 NOTE — Plan of Care (Signed)
[facility]  Ms. Welton FlakesKhan is a 62 year old Urdu speaking female with pmh of HTN, HLD, and DM type II; who presented with epigastric pain.  Vital signs revealed temperature 100.8 F, pulse 74-101, respirations 17-24, blood pressure 148/72-160 7/83, and O2 saturation 90-98%.  Labs revealed WBC 17.7, sodium 130, chloride 93, glucose 255,  troponin <0.03, and LFTs within normal limits.  CT abdomen pelvis revealed distended gallbladder with pericholecystic edema concerning for possible acute cholecystitis.  Dr. Chevis PrettyPaul Toth general surgery consulted and [facility].  Patient was empirically treated with ceftriaxone and metronidazole.  [facility] called to admit.  Accepted as inpatient to a telemetry bed.

## 2018-03-23 NOTE — Anesthesia Postprocedure Evaluation (Signed)
Anesthesia Post Note  Patient: Janae BridgemanSughra Vanderploeg  Procedure(s) Performed: LAPAROSCOPIC CHOLECYSTECTOMY WITH INTRAOPERATIVE CHOLANGIOGRAM (N/A Abdomen)     Patient location during evaluation: PACU Anesthesia Type: General Level of consciousness: awake and alert and oriented Pain management: pain level controlled Vital Signs Assessment: post-procedure vital signs reviewed and stable Respiratory status: spontaneous breathing, nonlabored ventilation and respiratory function stable Cardiovascular status: blood pressure returned to baseline and stable Postop Assessment: no apparent nausea or vomiting Anesthetic complications: no    Last Vitals:  Vitals:   03/23/18 1500 03/23/18 1515  BP: 130/72 125/73  Pulse: 97 96  Resp: 16 18  Temp:  37.2 C  SpO2: 99% 98%    Last Pain:  Vitals:   03/23/18 1515  TempSrc:   PainSc: 0-No pain                 Everlynn Sagun A.

## 2018-03-23 NOTE — Progress Notes (Signed)
PROGRESS NOTE    Kathryn BridgemanSughra Christian  UJW:[id number] DOB: 05/17/1956 DOA: 03/22/2018 PCP: [facility]    Brief Narrative:  62 year old female who presented with epigastric abdominal pain.  She does have significant past medical history for hypertension, dyslipidemia and type 2 diabetes mellitus.  Reported acute epigastric abdominal pain, sudden in onset, sharp in nature, radiated to her chest, and associated with vomiting.  On the initial physical examination temperature 100.8, heart rate 74-101, respiratory rate 17-24, blood pressure 148/72, oxygen saturation 90 to 98%.  Moist mucous membranes, lungs clear to auscultation, heart S1-S2 present and rhythmic, abdomen with right upper quadrant tenderness to deep palpation, no masses, no rebound or guarding, positive left lower extremity edema, nonpitting.  Sodium 130, potassium 3.7, chloride 93, bicarb 25, glucose 255, BUN 11, creatinine 0.58, AST 31, ALT 34, lipase 24, total bilirubin 0.4, white count 17.7, hemoglobin 14.7, hematocrit 43.6, platelets 236, urinalysis specific gravity less than 1.005, CT of the chest and abdomen, no pulmonary embolism, distended gallbladder with suggestion of pericholecystic edema.  Chest x-ray negative for infiltrates.  EKG normal sinus rhythm, normal axis, normal intervals.  Patient was admitted to the hospital with the working diagnosis of sepsis due to acute cholecystitis.  Assessment & Plan:   Principal Problem:   Epigastric abdominal pain Active Problems:   Cholecystitis   Left leg pain   Obesity (BMI 30-39.9)   SIRS (systemic inflammatory response syndrome) (HCC)   1. Sepsis due to acute cholecystitis. Will continue IV fluids with isotonic saline at 75 ml per hour, will continue antibiotic therapy with IV ceftriaxone and metronidazole. Scheduled for cholecystectomy today. Will continue to follow on cell count, temperature curve and cultures.   2. T2DM. Patient with nothing by mouth in preparation for  surgery, will continue capillary glucose monitoring with insulin sliding scale, will calculate requirements before starting basal insulin, capillary glucose 253 and 194.   3. Left lower extremity edema. Chronic edema, per patient's family had negative US doppler as outpatient over last month, will follow on repeat study ordered on admission.   4. HTN. Will continue blood pressure monitoring, currently not on medications.   5. Obesity. Calculated BMI 37   DVT prophylaxis: scd   Code Status: full Family Communication: I spoke with patient's daughter at the bedside and all questions were addressed.  Disposition Plan/ discharge barriers: pending surgical procedure and post op care.    Consultants:   Surgery   Procedures:     Antimicrobials:   ceftriaxone IV   metronidazole IV     Subjective: Patient with persistent abdominal pain, moderate in intensity, with no radiation, no associated nausea or vomiting, improved with analgesics. Chronic lower extremity edema on the right, had recent negative doppler US as outpatient.   Objective: Vitals:   03/23/18 0224 03/23/18 0228 03/23/18 0703 03/23/18 1103  BP:  132/69 137/72 (!) 145/81  Pulse:  93 88 (!) 101  Resp:  18 16 16   Temp:  98 F (36.7 C) 98.8 F (37.1 C) 100.2 F (37.9 C)  TempSrc:    Oral  SpO2:  96% 94% 94%  Weight: 89.5 kg     Height:        Intake/Output Summary (Last 24 hours) at 03/23/2018 1114 Last data filed at 03/23/2018 1000 Gross per 24 hour  Intake 702.32 ml  Output -  Net 702.32 ml   Filed Weights   03/22/18 1925 03/23/18 0224  Weight: 90.3 kg 89.5 kg    Examination:  General: Not in pain or dyspnea, deconditioned  Neurology: Awake and alert, non focal  E ENT: no pallor, no icterus, oral mucosa moist Cardiovascular: No JVD. S1-S2 present, rhythmic, no gallops, rubs, or murmurs. Left  lower extremity edema, non pitting ++/+++ Pulmonary: vesicular breath sounds bilaterally, adequate air  movement, no wheezing, rhonchi or rales. Gastrointestinal. Abdomen distended, no organomegaly, tender to deep palpation but no rebound or guarding Skin. No rashes Musculoskeletal: no joint deformities     Data Reviewed: I have personally reviewed following labs and imaging studies  CBC: Recent Labs  Lab 03/22/18 2054 03/23/18 0536  WBC 17.7* 13.9*  NEUTROABS 15.7*  --   HGB 14.7 13.8  HCT 43.6 41.4  MCV 76.0* 77.1*  PLT 236 233   Basic Metabolic Panel: Recent Labs  Lab 03/22/18 2116 03/23/18 0536  NA 130* 137  K 3.7 3.8  CL 93* 97*  CO2 25 29  GLUCOSE 255* 260*  BUN 11 9  CREATININE 0.58 0.58  CALCIUM 8.6* 8.5*   GFR: Estimated Creatinine Clearance: 74.2 mL/min (by C-G formula based on SCr of 0.58 mg/dL). Liver Function Tests: Recent Labs  Lab 03/22/18 2116 03/23/18 0536  AST 31 25  ALT 34 30  ALKPHOS 80 71  BILITOT 0.4 0.6  PROT 7.1 6.5  ALBUMIN 3.8 3.2*   Recent Labs  Lab 03/22/18 2116  LIPASE 24   No results for input(s): AMMONIA in the last 168 hours. Coagulation Profile: No results for input(s): INR, PROTIME in the last 168 hours. Cardiac Enzymes: Recent Labs  Lab 03/22/18 2054  TROPONINI <0.03   BNP (last 3 results) No results for input(s): PROBNP in the last 8760 hours. HbA1C: No results for input(s): HGBA1C in the last 72 hours. CBG: Recent Labs  Lab 03/23/18 0753 03/23/18 1109  GLUCAP 253* 194*   Lipid Profile: No results for input(s): CHOL, HDL, LDLCALC, TRIG, CHOLHDL, LDLDIRECT in the last 72 hours. Thyroid Function Tests: No results for input(s): TSH, T4TOTAL, FREET4, T3FREE, THYROIDAB in the last 72 hours. Anemia Panel: No results for input(s): VITAMINB12, FOLATE, FERRITIN, TIBC, IRON, RETICCTPCT in the last 72 hours.    Radiology Studies: I have reviewed all of the imaging during this hospital visit personally     Scheduled Meds: . [MAR Hold] enoxaparin (LOVENOX) injection  40 mg Subcutaneous Q24H  . [MAR Hold]  insulin aspart  0-9 Units Subcutaneous Q4H  . [MAR Hold] sodium chloride flush  3 mL Intravenous Q12H   Continuous Infusions: . [MAR Hold] sodium chloride Stopped (03/23/18 0215)  . sodium chloride Stopped (03/23/18 0820)  . [MAR Hold] cefTRIAXone (ROCEPHIN)  IV Stopped (03/23/18 0834)  . [MAR Hold] metronidazole Stopped (03/23/18 0754)     LOS: 0 days        Evie Croston Annett Gulaaniel Kollin Udell, MD [facility] Pager [phone]

## 2018-03-23 NOTE — Consult Note (Addendum)
Reason for Consult: cholelithiasis/cholecystitis CC:  Epigastric pain Referring Physician: DR. Jerilynn Mages Arrrien  Kathryn Christian is an 62 y.o. female.   HPI: 62 y/o with abdominal/chest pain who speaks Urdu. Family did the translation for the staff.  She has chronic neck pain.  Her son Kathryn Christian is with her and translating for [place].  He says the pain started Tuesday, 03/21/2018 in the evening.  She got better and she went to work Wednesday, but by noon yesterday the pain was so severe she had to go home.  This was discussed with [facility] medical and it she was given some mag citrate which she vomited.  She was taken to the [place] where she underwent work-up below.  She had some chills at home, and fever since admission.  She continues to be febrile and her pain which was midepigastric and chest is now primarily in her right upper quadrant.  Work up in the ED shows low grade fever, vital signs are stable.  Labs last p.m. at 2100 hrs, shows a glucose of 255, sodium of 130, LFTs are normal.  Lipase is 31.  CBC is 17.5, hemoglobin 14.7, hematocrit 43.6, platelets 236,000.  Repeat labs this a.m. again showed the LFTs are normal, glucose is 260, sodium is up to 137.  WBC improved at 13.9, H/H: 13.8/41.4, platelets 233,000.  D-dimer 0.69.  Urinalysis was normal.  Admission chest x-ray shows low lung volume, but no active disease.  CT of the chest showed no evidence of pulmonary embolus.  No evidence of active pulmonary disease.  CT of the abdomen the pelvis shows a distended gallbladder and possible pericholecystic edema.  Abdominal ultrasound obtained around 3:15 AM: Shows cholelithiasis with gallbladder sludge, gallbladder neck stone measuring 1.3 cm, mild peri-cholecystic edema without significant wall thickening, common bile duct was 4.2 mm.  Changes were nonspecific but indicative of acute cholecystitis.  Patient also had diffuse fatty infiltration of the liver.  We are asked to see.  Past Medical  History:  Diagnosis Date  . Diabetes mellitus without complication ([place]) she is currently on Glucophage, she has been out 1 week.  She is taking this intermittently and recently restarted with some lower extremity neuropathy.   . High cholesterol   . Hypertension     History reviewed. No pertinent surgical history. No prior surgeries.  History reviewed. No pertinent family history.  Social History:  reports that she has never smoked. She has never used smokeless tobacco. She reports that she does not drink alcohol or use drugs. EtOH: None Drugs: None Tobacco: None. She works in a Special educational needs teacher.  Allergies: No Known Allergies  Medications:  Prior to Admission:  Medications Prior to Admission  Medication Sig Dispense Refill Last Dose  . metFORMIN (GLUCOPHAGE-XR) 500 MG 24 hr tablet Take 500 mg by mouth daily.   Past Week at Unknown time   Scheduled: . enoxaparin (LOVENOX) injection  40 mg Subcutaneous Q24H  . insulin aspart  0-9 Units Subcutaneous Q4H  . sodium chloride flush  3 mL Intravenous Q12H   Continuous: . sodium chloride Stopped (03/23/18 0215)  . sodium chloride 75 mL/hr at 03/23/18 0600  . cefTRIAXone (ROCEPHIN)  IV    . metronidazole 500 mg (03/23/18 0654)   Anti-infectives (From admission, onward)   Start     Dose/Rate Route Frequency Ordered Stop   03/23/18 1000  cefTRIAXone (ROCEPHIN) 2 g in sodium chloride 0.9 % 100 mL IVPB     2 g 200  mL/hr over 30 Minutes Intravenous Every 24 hours 03/23/18 0223     03/23/18 0600  metroNIDAZOLE (FLAGYL) IVPB 500 mg     500 mg 100 mL/hr over 60 Minutes Intravenous Every 8 hours 03/23/18 0223     03/23/18 0000  cefTRIAXone (ROCEPHIN) 1 g in sodium chloride 0.9 % 100 mL IVPB     1 g 200 mL/hr over 30 Minutes Intravenous  Once 03/22/18 2359 03/23/18 0048   03/23/18 0000  metroNIDAZOLE (FLAGYL) IVPB 500 mg     500 mg 100 mL/hr over 60 Minutes Intravenous  Once 03/22/18 2359 03/23/18 0203      Results for orders placed  or performed during the hospital encounter of 03/22/18 (from the past 48 hour(s))  Troponin I     Status: None   Collection Time: 03/22/18  8:54 PM  Result Value Ref Range   Troponin I <0.03 <0.03 ng/mL    Comment: Performed at [facility], [address] [place], [place] [address]  CBC with Differential     Status: Abnormal   Collection Time: 03/22/18  8:54 PM  Result Value Ref Range   WBC 17.7 (H) 4.0 - 10.5 K/uL   RBC 5.74 (H) 3.87 - 5.11 MIL/uL   Hemoglobin 14.7 12.0 - 15.0 g/dL   HCT 43.6 36.0 - 46.0 %   MCV 76.0 (L) 78.0 - 100.0 fL   MCH 25.6 (L) 26.0 - 34.0 pg   MCHC 33.7 30.0 - 36.0 g/dL   RDW 13.8 11.5 - 15.5 %   Platelets 236 150 - 400 K/uL   Neutrophils Relative % 89 %   Neutro Abs 15.7 (H) 1.7 - 7.7 K/uL   Lymphocytes Relative 7 %   Lymphs Abs 1.3 0.7 - 4.0 K/uL   Monocytes Relative 4 %   Monocytes Absolute 0.8 0.1 - 1.0 K/uL   Eosinophils Relative 0 %   Eosinophils Absolute 0.0 0.0 - 0.7 K/uL   Basophils Relative 0 %   Basophils Absolute 0.0 0.0 - 0.1 K/uL    Comment: Performed at [facility], [address] [place], [place] [address]  Comprehensive metabolic panel     Status: Abnormal   Collection Time: 03/22/18  9:16 PM  Result Value Ref Range   Sodium 130 (L) 135 - 145 mmol/L   Potassium 3.7 3.5 - 5.1 mmol/L   Chloride 93 (L) 98 - 111 mmol/L   CO2 25 22 - 32 mmol/L   Glucose, Bld 255 (H) 70 - 99 mg/dL   BUN 11 8 - 23 mg/dL   Creatinine, Ser 0.58 0.44 - 1.00 mg/dL   Calcium 8.6 (L) 8.9 - 10.3 mg/dL   Total Protein 7.1 6.5 - 8.1 g/dL   Albumin 3.8 3.5 - 5.0 g/dL   AST 31 15 - 41 U/L   ALT 34 0 - 44 U/L   Alkaline Phosphatase 80 38 - 126 U/L   Total Bilirubin 0.4 0.3 - 1.2 mg/dL   GFR calc non Af Amer >60 >60 mL/min   GFR calc Af Amer >60 >60 mL/min    Comment: (NOTE) The eGFR has been calculated using the CKD EPI equation. This calculation has not been validated in all clinical situations. eGFR's persistently <60 mL/min  signify possible Chronic Kidney Disease.    Anion gap 12 5 - 15    Comment: Performed at [facility], [address] [place], [place] [address]  Lipase, blood  Status: None   Collection Time: 03/22/18  9:16 PM  Result Value Ref Range   Lipase 24 11 - 51 U/L    Comment: Performed at [facility], [address] [place], [place] [address]  Urinalysis, Routine w reflex microscopic     Status: Abnormal   Collection Time: 03/23/18 12:45 AM  Result Value Ref Range   Color, Urine YELLOW YELLOW   APPearance CLEAR CLEAR   Specific Gravity, Urine <1.005 (L) 1.005 - 1.030   pH 7.0 5.0 - 8.0   Glucose, UA 250 (A) NEGATIVE mg/dL   Hgb urine dipstick NEGATIVE NEGATIVE   Bilirubin Urine NEGATIVE NEGATIVE   Ketones, ur NEGATIVE NEGATIVE mg/dL   Protein, ur NEGATIVE NEGATIVE mg/dL   Nitrite NEGATIVE NEGATIVE   Leukocytes, UA NEGATIVE NEGATIVE    Comment: Microscopic not done on urines with negative protein, blood, leukocytes, nitrite, or glucose < 500 mg/dL. Performed at [facility], [address] [place], [place] [address]   Lactic acid, plasma     Status: None   Collection Time: 03/23/18  2:48 AM  Result Value Ref Range   Lactic Acid, Venous 1.5 0.5 - 1.9 mmol/L    Comment: Performed at [facility], [place] [address], [place], [place] [address]  CBC     Status: Abnormal   Collection Time: 03/23/18  5:36 AM  Result Value Ref Range   WBC 13.9 (H) 4.0 - 10.5 K/uL   RBC 5.37 (H) 3.87 - 5.11 MIL/uL   Hemoglobin 13.8 12.0 - 15.0 g/dL   HCT 41.4 36.0 - 46.0 %   MCV 77.1 (L) 78.0 - 100.0 fL   MCH 25.7 (L) 26.0 - 34.0 pg   MCHC 33.3 30.0 - 36.0 g/dL   RDW 13.8 11.5 - 15.5 %   Platelets 233 150 - 400 K/uL    Comment: Performed at [facility], [place] [address] [place], [place] [address]  Comprehensive metabolic panel     Status: Abnormal   Collection Time: 03/23/18  5:36 AM  Result Value Ref Range   Sodium 137 135 -  145 mmol/L   Potassium 3.8 3.5 - 5.1 mmol/L   Chloride 97 (L) 98 - 111 mmol/L   CO2 29 22 - 32 mmol/L   Glucose, Bld 260 (H) 70 - 99 mg/dL   BUN 9 8 - 23 mg/dL   Creatinine, Ser 0.58 0.44 - 1.00 mg/dL   Calcium 8.5 (L) 8.9 - 10.3 mg/dL   Total Protein 6.5 6.5 - 8.1 g/dL   Albumin 3.2 (L) 3.5 - 5.0 g/dL   AST 25 15 - 41 U/L   ALT 30 0 - 44 U/L   Alkaline Phosphatase 71 38 - 126 U/L   Total Bilirubin 0.6 0.3 - 1.2 mg/dL   GFR calc non Af Amer >60 >60 mL/min   GFR calc Af Amer >60 >60 mL/min    Comment: (NOTE) The eGFR has been calculated using the CKD EPI equation. This calculation has not been validated in all clinical situations. eGFR's persistently <60 mL/min signify possible Chronic Kidney Disease.    Anion gap 11 5 - 15    Comment: Performed at [facility], [place] [address], [place], [place] [address]  Lactic acid, plasma     Status: None   Collection Time: 03/23/18  5:36 AM  Result Value Ref Range   Lactic Acid, Venous 1.3 0.5 - 1.9 mmol/L    Comment: Performed  at [facility], [place] [address], [place], [place] [address]    Dg Chest 2 View  Result Date: 03/22/2018 CLINICAL DATA:  Chest pain EXAM: CHEST - 2 VIEW COMPARISON:  None. FINDINGS: Mildly low lung volume. No acute airspace disease or effusion. Normal heart size. No pneumothorax. IMPRESSION: No active cardiopulmonary disease.  Low lung volume. Electronically Signed   By: Donavan Foil M.D.   On: 03/22/2018 21:36   Ct Angio Chest Pe W/cm &/or Wo Cm  Result Date: 03/22/2018 CLINICAL DATA:  Epigastric pain for 15 hours. Mid chest pain. Elevated white cell count. EXAM: CT ANGIOGRAPHY CHEST CT ABDOMEN AND PELVIS WITH CONTRAST TECHNIQUE: Multidetector CT imaging of the chest was performed using the standard protocol during bolus administration of intravenous contrast. Multiplanar CT image reconstructions and MIPs were obtained to evaluate the vascular anatomy. Multidetector CT imaging of  the abdomen and pelvis was performed using the standard protocol during bolus administration of intravenous contrast. CONTRAST:  151m ISOVUE-370 IOPAMIDOL (ISOVUE-370) INJECTION 76% COMPARISON:  None. FINDINGS: CTA CHEST FINDINGS Cardiovascular: Motion artifact limits examination. There is good opacification of the central and segmental pulmonary arteries. No focal filling defects. No evidence of significant pulmonary embolus. Normal heart size. No pericardial effusion. Normal caliber thoracic aorta. No aortic dissection. Great vessel origins are patent. Mediastinum/Nodes: No significant lymphadenopathy in the chest. Esophagus is decompressed. No abnormal mediastinal gas or fluid collections. Lungs/Pleura: Motion artifact limits examination. There is no evidence of focal consolidation or significant airspace disease. No pleural effusions. No pneumothorax. Airways are patent. Musculoskeletal: Degenerative changes in the spine. No destructive bone lesions. Review of the MIP images confirms the above findings. CT ABDOMEN and PELVIS FINDINGS Hepatobiliary: No focal liver lesions. Homogeneous liver parenchymal pattern. The gallbladder is mildly distended and there is suggestion of pericholecystic edema. No stones are identified but changes may represent acute cholecystitis. No bile duct dilatation. Pancreas: Unremarkable. No pancreatic ductal dilatation or surrounding inflammatory changes. Spleen: Normal in size without focal abnormality. Adrenals/Urinary Tract: No adrenal gland nodules. Renal nephrograms are symmetrical. Small cyst on the left kidney. No hydronephrosis or hydroureter. Bladder is unremarkable. Stomach/Bowel: Stomach, small bowel, and colon are not abnormally distended. Scattered stool throughout the colon. No wall thickening is appreciated. Appendix is normal. Vascular/Lymphatic: No significant vascular findings are present. No enlarged abdominal or pelvic lymph nodes. Reproductive: Uterus and bilateral  adnexa are unremarkable. Other: No abdominal wall hernia or abnormality. No abdominopelvic ascites. Musculoskeletal: No acute or significant osseous findings. Review of the MIP images confirms the above findings. IMPRESSION: 1. No evidence of significant pulmonary embolus. 2. No evidence of active pulmonary disease. 3. Distended gallbladder with suggestion of pericholecystic edema. This could indicate acute cholecystitis. Consider ultrasound for further evaluation. 4. No evidence of bowel obstruction or inflammation. Electronically Signed   By: WLucienne CapersM.D.   On: 03/22/2018 23:36   Ct Abdomen Pelvis W Contrast  Result Date: 03/22/2018 CLINICAL DATA:  Epigastric pain for 15 hours. Mid chest pain. Elevated white cell count. EXAM: CT ANGIOGRAPHY CHEST CT ABDOMEN AND PELVIS WITH CONTRAST TECHNIQUE: Multidetector CT imaging of the chest was performed using the standard protocol during bolus administration of intravenous contrast. Multiplanar CT image reconstructions and MIPs were obtained to evaluate the vascular anatomy. Multidetector CT imaging of the abdomen and pelvis was performed using the standard protocol during bolus administration of intravenous contrast. CONTRAST:  1049mISOVUE-370 IOPAMIDOL (ISOVUE-370) INJECTION 76% COMPARISON:  None. FINDINGS: CTA CHEST FINDINGS Cardiovascular: Motion artifact limits examination.  There is good opacification of the central and segmental pulmonary arteries. No focal filling defects. No evidence of significant pulmonary embolus. Normal heart size. No pericardial effusion. Normal caliber thoracic aorta. No aortic dissection. Great vessel origins are patent. Mediastinum/Nodes: No significant lymphadenopathy in the chest. Esophagus is decompressed. No abnormal mediastinal gas or fluid collections. Lungs/Pleura: Motion artifact limits examination. There is no evidence of focal consolidation or significant airspace disease. No pleural effusions. No pneumothorax.  Airways are patent. Musculoskeletal: Degenerative changes in the spine. No destructive bone lesions. Review of the MIP images confirms the above findings. CT ABDOMEN and PELVIS FINDINGS Hepatobiliary: No focal liver lesions. Homogeneous liver parenchymal pattern. The gallbladder is mildly distended and there is suggestion of pericholecystic edema. No stones are identified but changes may represent acute cholecystitis. No bile duct dilatation. Pancreas: Unremarkable. No pancreatic ductal dilatation or surrounding inflammatory changes. Spleen: Normal in size without focal abnormality. Adrenals/Urinary Tract: No adrenal gland nodules. Renal nephrograms are symmetrical. Small cyst on the left kidney. No hydronephrosis or hydroureter. Bladder is unremarkable. Stomach/Bowel: Stomach, small bowel, and colon are not abnormally distended. Scattered stool throughout the colon. No wall thickening is appreciated. Appendix is normal. Vascular/Lymphatic: No significant vascular findings are present. No enlarged abdominal or pelvic lymph nodes. Reproductive: Uterus and bilateral adnexa are unremarkable. Other: No abdominal wall hernia or abnormality. No abdominopelvic ascites. Musculoskeletal: No acute or significant osseous findings. Review of the MIP images confirms the above findings. IMPRESSION: 1. No evidence of significant pulmonary embolus. 2. No evidence of active pulmonary disease. 3. Distended gallbladder with suggestion of pericholecystic edema. This could indicate acute cholecystitis. Consider ultrasound for further evaluation. 4. No evidence of bowel obstruction or inflammation. Electronically Signed   By: Lucienne Capers M.D.   On: 03/22/2018 23:36   US Abdomen Limited Ruq  Result Date: 03/23/2018 CLINICAL DATA:  Epigastric abdominal pain since yesterday. EXAM: ULTRASOUND ABDOMEN LIMITED RIGHT UPPER QUADRANT COMPARISON:  CT abdomen and pelvis 03/22/2018 FINDINGS: Gallbladder: Cholelithiasis and gallbladder  sludge. Gallbladder neck stone measures 1.3 cm. Mild pericholecystic edema without significant wall thickening. Murphy's sign is indeterminate due to patient medication. Common bile duct: Diameter: 4.2 mm, normal Liver: Diffusely increased liver parenchymal echotexture suggesting fatty infiltration. No focal liver lesions demonstrated. Portal vein is patent on color Doppler imaging with normal direction of blood flow towards the liver. IMPRESSION: Cholelithiasis and sludge in the gallbladder. Mild pericholecystic edema without wall thickening. Changes are nonspecific but may indicate acute cholecystitis in the appropriate clinical setting. Diffuse fatty infiltration of the liver. Electronically Signed   By: Lucienne Capers M.D.   On: 03/23/2018 03:54    Review of Systems  Constitutional: Positive for chills and fever.  HENT: Negative.   Eyes: Negative.   Respiratory: Negative.   Cardiovascular: Positive for leg swelling (mostly LLE).  Gastrointestinal: Positive for abdominal pain, nausea (Nausea and vomiting with mag citrate but none before.) and vomiting. Negative for blood in stool, constipation, diarrhea, heartburn and melena.       Abdominal pain has been midepigastric, and going into her chest.  Currently her pain is primarily in the right upper quadrant.  Genitourinary: Negative.   Musculoskeletal: Negative.   Skin: Negative.   Neurological: Negative.        She has some pain in her lower extremities been attributed to her diabetes.  Endo/Heme/Allergies: Negative.   Psychiatric/Behavioral: Negative.    Blood pressure 137/72, pulse 88, temperature 98.8 F (37.1 C), resp. rate 16, height '5\' 1"'  (1.549 m),  weight 89.5 kg, SpO2 94 %. Physical Exam  Constitutional: She is oriented to person, place, and time. She appears well-developed and well-nourished. No distress.  She is in no distress but she is having pain in her right upper quadrant especially with palpation.  She feels febrile, and  her heart rate is elevated.  HENT:  Head: Normocephalic and atraumatic.  Mouth/Throat: Oropharynx is clear and moist. No oropharyngeal exudate.  Eyes: Right eye exhibits no discharge. Left eye exhibits no discharge. No scleral icterus.  Pupils are equal  Neck: Normal range of motion. Neck supple. No JVD present. No tracheal deviation present. No thyromegaly present.  Cardiovascular: Regular rhythm, normal heart sounds and intact distal pulses.  No murmur heard. Heart rates in the 90s, at rest.  Respiratory: Effort normal and breath sounds normal. No respiratory distress. She has no wheezes. She has no rales. She exhibits no tenderness.  GI: Soft. She exhibits no distension and no mass. There is tenderness (She is especially tender in the right upper quadrant mild tenderness left upper quadrant.). There is no rebound and no guarding.  Musculoskeletal: She exhibits edema (Trace edema both lower extremities.) and tenderness (She is complaining of tenderness in the left lower extremity.).  Lymphadenopathy:    She has no cervical adenopathy.  Neurological: She is alert and oriented to person, place, and time. A cranial nerve deficit is present.  Skin: Skin is warm and dry. No rash noted. She is not diaphoretic. No erythema. No pallor.  Psychiatric: She has a normal mood and affect. Her behavior is normal. Judgment and thought content normal.  She speaks minimal English but her son, speaks fluent [place] and did the interpretations for [place].    Assessment/Plan: Acute Cholecystitis/cholelithiasis. Type 2 diabetes -no meds for 1 week Questionable diabetic neuropathy DVT evaluation pending-CT negative for PE Hypertension Hyperlipidemia  Plan: Patient has cholelithiasis/cholecystitis.  We will tentatively place her on the schedule for surgery later today.  Agree with ongoing antibiotics and DVT evaluation. Her IV is not working and has to be replaced. We will hold Lovenox until we determine whether  we can do surgery later today.  I have discussed the risk and benefits with her son who speaks fluent [place].  Will review with Dr. Armandina Gemma.   Ethelda Deangelo 03/23/2018, 7:12 AM

## 2018-03-23 NOTE — Op Note (Signed)
Procedure Note  Pre-operative Diagnosis: Acute cholecystitis, cholelithiasis  Post-operative Diagnosis:  same  Surgeon:  Kathryn Levelodd Elease Swarm, MD  Assistant: Kathryn Marlyne BeardsJennings, PA-C   Procedure:  Laparoscopic cholecystectomy with intra-operative cholangiography  Anesthesia:  General  Estimated Blood Loss:  minimal  Drains: None         Specimen: Gallbladder to pathology  Indications: Patient is a 62 year old female referred from [facility] with signs and symptoms of acute cholecystitis and cholelithiasis.  Ultrasound shows a gallstone impacted in the neck of the gallbladder.  Patient now comes to surgery for cholecystectomy.  Procedure Details:  The patient was seen in the pre-op holding area. The risks, benefits, complications, treatment options, and expected outcomes were previously discussed with the patient. The patient agreed with the proposed plan and has signed the informed consent form.  The patient was brought to the Operating Room, identified as Kathryn Christian and the procedure verified as laparoscopic cholecystectomy with intraoperative cholangiography. A "time out" was completed and the above information confirmed.  The patient was placed in the supine position. Following induction of general anesthesia, the abdomen was prepped and draped in the usual aseptic fashion.  An incision was made in the skin near the umbilicus. The midline fascia was incised and the peritoneal cavity was entered and a Hasson canula was introduced under direct vision.  The Hasson canula was secured with a 0-Vicryl pursestring suture. Pneumoperitoneum was established with carbon dioxide. Additional trocars were introduced under direct vision along the right costal margin in the midline, mid-clavicular line, and anterior axillary line.   The gallbladder was identified.  Omental adhesions to the gallbladder are taken down.  Gallbladder is punctured with the aspirating trocar and clear bile is aspirated.  The  fundus of the gallbladder was grasped and retracted cephalad. Adhesions were taken down bluntly and the electrocautery was utilized as needed, taking care not to injure any adjacent structures. The infundibulum was grasped and retracted laterally, exposing the peritoneum overlying the triangle of Calot. The peritoneum was incised and structures exposed with blunt dissection. The cystic duct was clearly identified, bluntly dissected circumferentially, and clipped at the neck of the gallbladder.  An incision was made in the cystic duct and the cholangiogram catheter introduced. The catheter was secured using an ligaclip.  Real-time cholangiography was performed using C-arm fluoroscopy.  There was rapid filling of a normal caliber common bile duct.  There was reflux of contrast into the left and right hepatic ductal systems.  There was free flow distally into the duodenum without filling defect or obstruction.  The catheter was removed from the peritoneal cavity.  The cystic duct was then ligated with surgical clips and divided. The cystic artery was identified, dissected circumferentially, ligated with ligaclips, and divided.  The gallbladder was dissected away from the liver bed using the electrocautery for hemostasis. The gallbladder was completely removed from the liver and placed into an endocatch bag. The gallbladder was removed in the endocatch bag through the umbilical port site and submitted to pathology for review.  The right upper quadrant was irrigated and the gallbladder bed was inspected. Hemostasis was achieved with the electrocautery.  Pneumoperitoneum was released after viewing removal of the trocars with good hemostasis noted. The umbilical wound was irrigated and the fascia was then closed with the pursestring suture.  Local anesthetic was infiltrated at all port sites. The skin incisions were closed with 4-0 Monocril subcuticular sutures and steri-strips and dressings were  applied.  Instrument, sponge, and needle counts  were correct at the conclusion of the case.  The patient was awakened from anesthesia and brought to the recovery room in stable condition.  The patient tolerated the procedure well.   Kathryn Levelodd Deshea Pooley, MD [facility] Office: [phone]

## 2018-03-23 NOTE — Anesthesia Procedure Notes (Signed)
Procedure Name: Intubation Date/Time: 03/23/2018 12:53 PM Performed by: Florene Routeeardon, Demico Ploch L, CRNA Patient Re-evaluated:Patient Re-evaluated prior to induction Oxygen Delivery Method: Circle system utilized Preoxygenation: Pre-oxygenation with 100% oxygen Induction Type: IV induction Ventilation: Mask ventilation without difficulty and Oral airway inserted - appropriate to patient size Laryngoscope Size: Hyacinth MeekerMiller and 2 Grade View: Grade I Tube type: Oral Tube size: 7.5 mm Number of attempts: 1 Airway Equipment and Method: Stylet Placement Confirmation: ETT inserted through vocal cords under direct vision,  positive ETCO2 and breath sounds checked- equal and bilateral Secured at: 22 cm Tube secured with: Tape Dental Injury: Teeth and Oropharynx as per pre-operative assessment

## 2018-03-23 NOTE — Transfer of Care (Signed)
Immediate Anesthesia Transfer of Care Note  Patient: Kathryn Christian  Procedure(s) Performed: LAPAROSCOPIC CHOLECYSTECTOMY WITH INTRAOPERATIVE CHOLANGIOGRAM (N/A Abdomen)  Patient Location: PACU  Anesthesia Type:General  Level of Consciousness: awake and alert   Airway & Oxygen Therapy: Patient Spontanous Breathing and Patient connected to face mask oxygen  Post-op Assessment: Report given to RN and Post -op Vital signs reviewed and stable  Post vital signs: Reviewed and stable  Last Vitals:  Vitals Value Taken Time  BP 135/67 03/23/2018  2:15 PM  Temp    Pulse 100 03/23/2018  2:16 PM  Resp 18 03/23/2018  2:16 PM  SpO2 98 % 03/23/2018  2:16 PM  Vitals shown include unvalidated device data.  Last Pain:  Vitals:   03/23/18 1113  TempSrc:   PainSc: 5       Patients Stated Pain Goal: 3 (03/23/18 1113)  Complications: No apparent anesthesia complications

## 2018-03-23 NOTE — H&P (Signed)
History and Physical    Kathryn BridgemanSughra Cohron Christian:[id number]:[id number] DOB: 02/29/1956 DOA: 03/22/2018  Referring MD/NP/PA: Meridee ScoreMichael Butler, MD [facility]  [facility]  Chief Complaint: Epigastric abdominal pain  I have personally briefly reviewed patient's [facility]   HPI: Kathryn AustriaSughra Welton FlakesKhan is a 62 y.o. mostly Urdu speaking female with medical history significant of HTN, HLD, and DM type II; who presents with epigastric abdominal pain which initially started around 7 PM on 8/13.  Patient's son helps translate for patient.  She describes the pain as sharp and stabbing with radiation up into her chest.  The following afternoon [facility] where [facility]-rays and blood work were performed.  There was reportedly some concern for a blockage. Patient was possibly given metoclopramide, advised to take magnesium citrate once home, and instructed to go to the emergency department if pain symptoms persisted or worsened.  After getting home patient took magnesium citrate with water as advised.  However, 1-2 hours later patient vomited.  She still complained of significant pain and was brought back to the emergency department for further evaluation.  Patient has chronic pain in her neck.  She also complains of left lower extremity pain over the last 2 to 3 months that was not provoked by fall or trauma.  Patient is not very mobile at baseline and 6 for prolonged periods of time reportedly while at work and at home.  ED Course: Upon [facility] patient found to be febrile up to 100.8 F , pulse 74-101, respirations 17-24, blood pressure 148/72-160 7/83, and O2 saturation 90-98%.  Labs revealed WBC 17.7, sodium 130, chloride 93, glucose 255,  troponin <0.03, and LFTs within normal limits.  CT abdomen pelvis revealed distended gallbladder with pericholecystic edema concerning for possible acute  cholecystitis.  Urinalysis was negative. Dr. Chevis PrettyPaul Toth general surgery consulted and recommended ultrasound not [facility] at [facility].  Patient was empirically treated with ceftriaxone and metronidazole.  [facility] called to admit.  Accepted as inpatient to a telemetry bed.  Review of Systems  Constitutional: Positive for chills and malaise/fatigue.  HENT: Negative for congestion and nosebleeds.   Eyes: Negative for photophobia and pain.  Respiratory: Negative for cough and shortness of breath.   Cardiovascular: Positive for chest pain and leg swelling.  Gastrointestinal: Positive for abdominal pain, nausea and vomiting.  Genitourinary: Negative for dysuria and frequency.  Musculoskeletal: Positive for myalgias and neck pain. Negative for falls.  Skin: Negative for itching and rash.  Neurological: Negative for focal weakness and loss of consciousness.  Psychiatric/Behavioral: Negative for substance abuse. The patient is not nervous/anxious.     Past Medical History:  Diagnosis Date  . Diabetes mellitus without complication (HCC)   . High cholesterol   . Hypertension     History reviewed. No pertinent surgical history.   reports that she has never smoked. She has never used smokeless tobacco. She reports that she does not drink alcohol or use drugs.  No Known Allergies  History reviewed. No pertinent family history.  Prior to Admission medications   Not on File    Physical Exam:  Constitutional: Obese female NAD, calm, comfortable Vitals:   03/22/18 2145 03/22/18 2230 03/22/18 2351 03/23/18 0000  BP: (!) 156/74 (!) 148/72 (!) 164/89 (!) 162/94  Pulse: 74 91 100 (!) 101  Resp: 17 (!) 22 20 (!) 24  Temp:    Marland Kitchen(!)  100.8 F (38.2 C)  TempSrc:    Oral  SpO2: 95% 90% 93% 92%  Weight:      Height:       Eyes: PERRL, lids and conjunctivae normal ENMT: Mucous membranes are moist. Posterior pharynx clear of any exudate or lesions.Normal dentition.  Neck: normal, supple, no  masses, no thyromegaly Respiratory: clear to auscultation bilaterally, no wheezing, no crackles. Normal respiratory effort. No accessory muscle use.  Cardiovascular: Regular rate and rhythm, no murmurs / rubs / gallops.  Trace lower extremity edema. 2+ pedal pulses. No carotid bruits.  Abdomen: Right upper quadrant tenderness present with palpation.  No masses palpated. No hepatosplenomegaly. Bowel sounds positive.  Musculoskeletal: no clubbing / cyanosis.  Tenderness palpation of the right lower extremity.  Skin: no rashes, lesions, ulcers. No induration Neurologic: CN 2-12 grossly intact. Sensation intact, DTR normal. Strength 5/5 in all 4.  Psychiatric: Normal judgment and insight. Alert and oriented x 3. Normal mood.     Labs on Admission: I have personally reviewed following labs and imaging studies  CBC: Recent Labs  Lab 03/22/18 2054  WBC 17.7*  NEUTROABS 15.7*  HGB 14.7  HCT 43.6  MCV 76.0*  PLT 236   Basic Metabolic Panel: Recent Labs  Lab 03/22/18 2116  NA 130*  K 3.7  CL 93*  CO2 25  GLUCOSE 255*  BUN 11  CREATININE 0.58  CALCIUM 8.6*   GFR: Estimated Creatinine Clearance: 74.6 mL/min (by C-G formula based on SCr of 0.58 mg/dL). Liver Function Tests: Recent Labs  Lab 03/22/18 2116  AST 31  ALT 34  ALKPHOS 80  BILITOT 0.4  PROT 7.1  ALBUMIN 3.8   Recent Labs  Lab 03/22/18 2116  LIPASE 24   No results for input(s): AMMONIA in the last 168 hours. Coagulation Profile: No results for input(s): INR, PROTIME in the last 168 hours. Cardiac Enzymes: Recent Labs  Lab 03/22/18 2054  TROPONINI <0.03   BNP (last 3 results) No results for input(s): PROBNP in the last 8760 hours. HbA1C: No results for input(s): HGBA1C in the last 72 hours. CBG: No results for input(s): GLUCAP in the last 168 hours. Lipid Profile: No results for input(s): CHOL, HDL, LDLCALC, TRIG, CHOLHDL, LDLDIRECT in the last 72 hours. Thyroid Function Tests: No results for  input(s): TSH, T4TOTAL, FREET4, T3FREE, THYROIDAB in the last 72 hours. Anemia Panel: No results for input(s): VITAMINB12, FOLATE, FERRITIN, TIBC, IRON, RETICCTPCT in the last 72 hours. Urine analysis:    Component Value Date/Time   COLORURINE YELLOW 03/23/2018 0045   APPEARANCEUR CLEAR 03/23/2018 0045   LABSPEC <1.005 (L) 03/23/2018 0045   PHURINE 7.0 03/23/2018 0045   GLUCOSEU 250 (A) 03/23/2018 0045   HGBUR NEGATIVE 03/23/2018 0045   BILIRUBINUR NEGATIVE 03/23/2018 0045   KETONESUR NEGATIVE 03/23/2018 0045   PROTEINUR NEGATIVE 03/23/2018 0045   NITRITE NEGATIVE 03/23/2018 0045   LEUKOCYTESUR NEGATIVE 03/23/2018 0045   Sepsis Labs: No results found for this or any previous visit (from the past 240 hour(s)).   Radiological Exams on Admission: Dg Chest 2 View  Result Date: 03/22/2018 CLINICAL DATA:  Chest pain EXAM: CHEST - 2 VIEW COMPARISON:  None. FINDINGS: Mildly low lung volume. No acute airspace disease or effusion. Normal heart size. No pneumothorax. IMPRESSION: No active cardiopulmonary disease.  Low lung volume. Electronically Signed   By: Jasmine Pang M.D.   On: 03/22/2018 21:36   Ct Angio Chest Pe W/cm &/or Wo Cm  Result Date: 03/22/2018 CLINICAL DATA:  Epigastric pain for 15 hours. Mid chest pain. Elevated white cell count. EXAM: CT ANGIOGRAPHY CHEST CT ABDOMEN AND PELVIS WITH CONTRAST TECHNIQUE: Multidetector CT imaging of the chest was performed using the standard protocol during bolus administration of intravenous contrast. Multiplanar CT image reconstructions and MIPs were obtained to evaluate the vascular anatomy. Multidetector CT imaging of the abdomen and pelvis was performed using the standard protocol during bolus administration of intravenous contrast. CONTRAST:  100mL ISOVUE-370 IOPAMIDOL (ISOVUE-370) INJECTION 76% COMPARISON:  None. FINDINGS: CTA CHEST FINDINGS Cardiovascular: Motion artifact limits examination. There is good opacification of the central and  segmental pulmonary arteries. No focal filling defects. No evidence of significant pulmonary embolus. Normal heart size. No pericardial effusion. Normal caliber thoracic aorta. No aortic dissection. Great vessel origins are patent. Mediastinum/Nodes: No significant lymphadenopathy in the chest. Esophagus is decompressed. No abnormal mediastinal gas or fluid collections. Lungs/Pleura: Motion artifact limits examination. There is no evidence of focal consolidation or significant airspace disease. No pleural effusions. No pneumothorax. Airways are patent. Musculoskeletal: Degenerative changes in the spine. No destructive bone lesions. Review of the MIP images confirms the above findings. CT ABDOMEN and PELVIS FINDINGS Hepatobiliary: No focal liver lesions. Homogeneous liver parenchymal pattern. The gallbladder is mildly distended and there is suggestion of pericholecystic edema. No stones are identified but changes may represent acute cholecystitis. No bile duct dilatation. Pancreas: Unremarkable. No pancreatic ductal dilatation or surrounding inflammatory changes. Spleen: Normal in size without focal abnormality. Adrenals/Urinary Tract: No adrenal gland nodules. Renal nephrograms are symmetrical. Small cyst on the left kidney. No hydronephrosis or hydroureter. Bladder is unremarkable. Stomach/Bowel: Stomach, small bowel, and colon are not abnormally distended. Scattered stool throughout the colon. No wall thickening is appreciated. Appendix is normal. Vascular/Lymphatic: No significant vascular findings are present. No enlarged abdominal or pelvic lymph nodes. Reproductive: Uterus and bilateral adnexa are unremarkable. Other: No abdominal wall hernia or abnormality. No abdominopelvic ascites. Musculoskeletal: No acute or significant osseous findings. Review of the MIP images confirms the above findings. IMPRESSION: 1. No evidence of significant pulmonary embolus. 2. No evidence of active pulmonary disease. 3.  Distended gallbladder with suggestion of pericholecystic edema. This could indicate acute cholecystitis. Consider ultrasound for further evaluation. 4. No evidence of bowel obstruction or inflammation. Electronically Signed   By: Burman NievesWilliam  Stevens M.D.   On: 03/22/2018 23:36   Ct Abdomen Pelvis W Contrast  Result Date: 03/22/2018 CLINICAL DATA:  Epigastric pain for 15 hours. Mid chest pain. Elevated white cell count. EXAM: CT ANGIOGRAPHY CHEST CT ABDOMEN AND PELVIS WITH CONTRAST TECHNIQUE: Multidetector CT imaging of the chest was performed using the standard protocol during bolus administration of intravenous contrast. Multiplanar CT image reconstructions and MIPs were obtained to evaluate the vascular anatomy. Multidetector CT imaging of the abdomen and pelvis was performed using the standard protocol during bolus administration of intravenous contrast. CONTRAST:  100mL ISOVUE-370 IOPAMIDOL (ISOVUE-370) INJECTION 76% COMPARISON:  None. FINDINGS: CTA CHEST FINDINGS Cardiovascular: Motion artifact limits examination. There is good opacification of the central and segmental pulmonary arteries. No focal filling defects. No evidence of significant pulmonary embolus. Normal heart size. No pericardial effusion. Normal caliber thoracic aorta. No aortic dissection. Great vessel origins are patent. Mediastinum/Nodes: No significant lymphadenopathy in the chest. Esophagus is decompressed. No abnormal mediastinal gas or fluid collections. Lungs/Pleura: Motion artifact limits examination. There is no evidence of focal consolidation or significant airspace disease. No pleural effusions. No pneumothorax. Airways are patent. Musculoskeletal: Degenerative changes in the spine. No destructive bone lesions. Review of  the MIP images confirms the above findings. CT ABDOMEN and PELVIS FINDINGS Hepatobiliary: No focal liver lesions. Homogeneous liver parenchymal pattern. The gallbladder is mildly distended and there is suggestion of  pericholecystic edema. No stones are identified but changes may represent acute cholecystitis. No bile duct dilatation. Pancreas: Unremarkable. No pancreatic ductal dilatation or surrounding inflammatory changes. Spleen: Normal in size without focal abnormality. Adrenals/Urinary Tract: No adrenal gland nodules. Renal nephrograms are symmetrical. Small cyst on the left kidney. No hydronephrosis or hydroureter. Bladder is unremarkable. Stomach/Bowel: Stomach, small bowel, and colon are not abnormally distended. Scattered stool throughout the colon. No wall thickening is appreciated. Appendix is normal. Vascular/Lymphatic: No significant vascular findings are present. No enlarged abdominal or pelvic lymph nodes. Reproductive: Uterus and bilateral adnexa are unremarkable. Other: No abdominal wall hernia or abnormality. No abdominopelvic ascites. Musculoskeletal: No acute or significant osseous findings. Review of the MIP images confirms the above findings. IMPRESSION: 1. No evidence of significant pulmonary embolus. 2. No evidence of active pulmonary disease. 3. Distended gallbladder with suggestion of pericholecystic edema. This could indicate acute cholecystitis. Consider ultrasound for further evaluation. 4. No evidence of bowel obstruction or inflammation. Electronically Signed   By: Burman Nieves M.D.   On: 03/22/2018 23:36    EKG: Independently reviewed.  Sinus rhythm 82 bpm Assessment/Plan Epigastric abdominal pain 2/2 suspected acute cholecystitis: Acute.  Patient presents with epigastric abdominal pain.  Labs including lipase and LFTs relatively unremarkable.  Patient tender to palpation of the right upper quadrant.  CT scan of the abdomen revealing distended gallbladder concerning for possible acute cholecystitis. - Admit to telemetry bed - N.p.o. - Check RUQ abdominal ultrasound - Continue empiric antibiotics of Rocephin and metronidazole - IV fluids normal saline at 75 mL/h - Morphine IV as  needed pain - Appreciate general surgery consultative services will follow-up for further recommendation  SIRS: Patient was initially noted to febrile up to 100.8 F, mildly tachycardic, and tachypneic with WBC noted to be 17.7.  Gallbladder thought to be possible source of symptoms.  Lactic acid level was not initially checked. - Check lactic acid level     Left leg pain: Patient complains of a 2 to 65-month history of left leg pain with no reported history of trauma or injury.  She is noted to be mostly sedimentary.  Question possibility of underlying DVT. - Check d-dimer - Check vascular Doppler ultrasound of the lower extremity  Diabetes mellitus type 2: Was initially found to be mildly hyperglycemic at 250.  Patient only on oral medications of metformin and reports running out of this medication last week.  Last hemoglobin A1c unknown. - Hypoglycemic protocol - Hold metformin - CBGs every 4 hours with sensitive SSI  Hypertension and hyperlipidemia: Patient not on any medications for treatment.  Obesity: BMI 37.28  DVT prophylaxis: Lovenox Code Status: Full Family Communication: Discussed plan of care with the patient family present at bedside Disposition Plan: To be determined Consults called: Surgery  Admission status: *inpatient  Clydie Braun MD [facility] Pager [phone]   If 7PM-7AM, please contact night-coverage [email or web address] Password [email or web address]  03/23/2018, 2:03 AM

## 2018-03-23 NOTE — Progress Notes (Signed)
Inpatient Diabetes Program Recommendations  AACE/ADA: New Consensus Statement on Inpatient Glycemic Control (2015)  Target Ranges:  Prepandial:   less than 140 mg/dL      Peak postprandial:   less than 180 mg/dL (1-2 hours)      Critically ill patients:  140 - 180 mg/dL   Lab Results  Component Value Date   GLUCAP 253 (H) 03/23/2018   Review of Glycemic Control  Diabetes history: DM 2 Outpatient Diabetes medications: Metformin 500 mg BID Current orders for Inpatient glycemic control: Novolog 0-9 units Q4 hours  Inpatient Diabetes Program Recommendations:   possible acute cholecystitis  Patient received first dose of correction scale this am for a glucose of 253 mg/dl. Please consider an A1c level to determine glucose control over the past 2-3 months.  Will follow trends while inpatient.  Thanks,  Christena DeemShannon Blakelynn Scheeler RN, MSN, BC-ADM Inpatient Diabetes Coordinator Team Pager [phone] (8a-5p)

## 2018-03-23 NOTE — Progress Notes (Signed)
Preliminary notes--Bilateral lower extremities venous duplex exam completed.  Right posterior and peroneal veins appear partially thrombosed. Left calf veins not well visualized, cannot exclude thrombosis existence.  Hongying Richardson DoppCole (RDMS RVT) 03/23/18 4:43 PM

## 2018-03-24 ENCOUNTER — Encounter (HOSPITAL_COMMUNITY): Payer: Self-pay | Admitting: Surgery

## 2018-03-24 DIAGNOSIS — I10 Essential (primary) hypertension: Secondary | ICD-10-CM

## 2018-03-24 DIAGNOSIS — E1169 Type 2 diabetes mellitus with other specified complication: Secondary | ICD-10-CM

## 2018-03-24 DIAGNOSIS — E785 Hyperlipidemia, unspecified: Secondary | ICD-10-CM

## 2018-03-24 DIAGNOSIS — I82402 Acute embolism and thrombosis of unspecified deep veins of left lower extremity: Secondary | ICD-10-CM

## 2018-03-24 LAB — CBC WITH DIFFERENTIAL/PLATELET
Basophils Absolute: 0 10*3/uL (ref 0.0–0.1)
Basophils Relative: 0 %
EOS PCT: 0 %
Eosinophils Absolute: 0 10*3/uL (ref 0.0–0.7)
HCT: 41.4 % (ref 36.0–46.0)
Hemoglobin: 13.4 g/dL (ref 12.0–15.0)
LYMPHS ABS: 1.3 10*3/uL (ref 0.7–4.0)
LYMPHS PCT: 7 %
MCH: 25.7 pg — AB (ref 26.0–34.0)
MCHC: 32.4 g/dL (ref 30.0–36.0)
MCV: 79.5 fL (ref 78.0–100.0)
MONO ABS: 0.4 10*3/uL (ref 0.1–1.0)
MONOS PCT: 2 %
Neutro Abs: 16.5 10*3/uL — ABNORMAL HIGH (ref 1.7–7.7)
Neutrophils Relative %: 91 %
PLATELETS: 232 10*3/uL (ref 150–400)
RBC: 5.21 MIL/uL — ABNORMAL HIGH (ref 3.87–5.11)
RDW: 14.3 % (ref 11.5–15.5)
WBC: 18.2 10*3/uL — ABNORMAL HIGH (ref 4.0–10.5)

## 2018-03-24 LAB — BASIC METABOLIC PANEL
Anion gap: 7 (ref 5–15)
BUN: 12 mg/dL (ref 8–23)
CALCIUM: 8.8 mg/dL — AB (ref 8.9–10.3)
CO2: 29 mmol/L (ref 22–32)
Chloride: 103 mmol/L (ref 98–111)
Creatinine, Ser: 0.66 mg/dL (ref 0.44–1.00)
GFR calc Af Amer: >60 mL/min (ref >60)
GFR calc non Af Amer: >60 mL/min (ref >60)
Glucose, Bld: 236 mg/dL — ABNORMAL HIGH (ref 70–99)
Potassium: 4.1 mmol/L (ref 3.5–5.1)
Sodium: 139 mmol/L (ref 135–145)

## 2018-03-24 LAB — GLUCOSE, CAPILLARY
GLUCOSE-CAPILLARY: 340 mg/dL — AB (ref 70–99)
Glucose-Capillary: 243 mg/dL — ABNORMAL HIGH (ref 70–99)

## 2018-03-24 MED ORDER — METFORMIN HCL ER 500 MG PO TB24: 500.0000 mg | ORAL_TABLET | Freq: Every day | ORAL | 0 refills | Status: DC

## 2018-03-24 MED ORDER — RIVAROXABAN 15 MG PO TABS
15.0000 mg | ORAL_TABLET | Freq: Two times a day (BID) | ORAL | Status: DC
  Administered 2018-03-24: 15 mg via ORAL
  Filled 2018-03-24: qty 1

## 2018-03-24 MED ORDER — RIVAROXABAN (XARELTO) VTE STARTER PACK (15 & 20 MG): ORAL_TABLET | ORAL | 0 refills | Status: DC

## 2018-03-24 MED ORDER — RIVAROXABAN 15 MG PO TABS: 15.0000 mg | ORAL_TABLET | Freq: Two times a day (BID) | ORAL | Status: DC

## 2018-03-24 MED ORDER — RIVAROXABAN (XARELTO) EDUCATION KIT FOR DVT/PE PATIENTS
PACK | Freq: Once | Status: AC
  Administered 2018-03-24: 12:00:00
  Filled 2018-03-24: qty 1

## 2018-03-24 MED ORDER — ACETAMINOPHEN 325 MG PO TABS: 650.0000 mg | ORAL_TABLET | Freq: Four times a day (QID) | ORAL | 0 refills | Status: AC | PRN

## 2018-03-24 NOTE — Progress Notes (Signed)
Assessment & Plan: POD#1 - lap chole with IOC for acute cholecystitis, cholelithiasis  Advance to carb modified diet  Encouraged ambulation  OK for discharge home from surgical standpoint  DM management and evaluation for DVT per medical service.  Will arrange follow up at [facility] office in 2-3 weeks.        Velora Heckler, MD, [facility]       Office: [phone]   Chief Complaint: Acute cholecystitis  Subjective: Patient smiling in bed, family at bedside providing translation.  No complaints.  Has not eaten very much yet.  Objective: Vital signs in last 24 hours: Temp:  [97.4 F (36.3 C)-100.2 F (37.9 C)] 97.4 F (36.3 C) (08/16 0500) Pulse Rate:  [64-102] 64 (08/16 0500) Resp:  [16-19] 18 (08/16 0500) BP: (121-145)/(67-81) 141/75 (08/16 0500) SpO2:  [93 %-99 %] 93 % (08/16 0500) Weight:  [90.3 kg] 90.3 kg (08/15 1113)    Intake/Output from previous day: 08/15 0701 - 08/16 0700 In: 2341.4 [P.O.:360; I.V.:1736.4; IV Piggyback:245] Out: 50 [Blood:50] Intake/Output this shift: No intake/output data recorded.  Physical Exam: HEENT - sclerae clear, mucous membranes moist Neck - soft Chest - clear bilaterally Cor - RRR Abdomen - soft, obese; dressings dry and intact; BS present Ext - no edema, non-tender Neuro - alert & oriented, no focal deficits  Lab Results:  Recent Labs    03/23/18 0536 03/24/18 0540  WBC 13.9* 18.2*  HGB 13.8 13.4  HCT 41.4 41.4  PLT 233 232   BMET Recent Labs    03/23/18 0536 03/24/18 0540  NA 137 139  K 3.8 4.1  CL 97* 103  CO2 29 29  GLUCOSE 260* 236*  BUN 9 12  CREATININE 0.58 0.66  CALCIUM 8.5* 8.8*   PT/INR No results for input(s): LABPROT, INR in the last 72 hours. Comprehensive Metabolic Panel:    Component Value Date/Time   NA 139 03/24/2018 0540   NA 137 03/23/2018 0536   K 4.1 03/24/2018 0540   K 3.8 03/23/2018 0536   CL 103 03/24/2018 0540   CL 97 (L) 03/23/2018 0536     CO2 29 03/24/2018 0540   CO2 29 03/23/2018 0536   BUN 12 03/24/2018 0540   BUN 9 03/23/2018 0536   CREATININE 0.66 03/24/2018 0540   CREATININE 0.58 03/23/2018 0536   GLUCOSE 236 (H) 03/24/2018 0540   GLUCOSE 260 (H) 03/23/2018 0536   CALCIUM 8.8 (L) 03/24/2018 0540   CALCIUM 8.5 (L) 03/23/2018 0536   AST 25 03/23/2018 0536   AST 31 03/22/2018 2116   ALT 30 03/23/2018 0536   ALT 34 03/22/2018 2116   ALKPHOS 71 03/23/2018 0536   ALKPHOS 80 03/22/2018 2116   BILITOT 0.6 03/23/2018 0536   BILITOT 0.4 03/22/2018 2116   PROT 6.5 03/23/2018 0536   PROT 7.1 03/22/2018 2116   ALBUMIN 3.2 (L) 03/23/2018 0536   ALBUMIN 3.8 03/22/2018 2116    Studies/Results: Dg Chest 2 View  Result Date: 03/22/2018 CLINICAL DATA:  Chest pain EXAM: CHEST - 2 VIEW COMPARISON:  None. FINDINGS: Mildly low lung volume. No acute airspace disease or effusion. Normal heart size. No pneumothorax. IMPRESSION: No active cardiopulmonary disease.  Low lung volume. Electronically Signed   By: Jasmine Pang M.D.   On: 03/22/2018 21:36   Dg Cholangiogram Operative  Result Date: 03/23/2018 CLINICAL DATA:  Cholecystectomy for cholelithiasis and acute cholecystitis. EXAM: INTRAOPERATIVE CHOLANGIOGRAM TECHNIQUE: Cholangiographic images  from the C-arm fluoroscopic device were submitted for interpretation post-operatively. Please see the procedural report for the amount of contrast and the fluoroscopy time utilized. COMPARISON:  Right upper quadrant abdominal ultrasound on 03/23/2018 FINDINGS: Intraoperative imaging with a C-arm demonstrates a normal opacified biliary tree without evidence of biliary obstruction or filling defect. Contrast enters the duodenum normally. IMPRESSION: Normal intraoperative cholangiogram. Electronically Signed   By: Irish LackGlenn  Yamagata M.D.   On: 03/23/2018 16:04   Ct Angio Chest Pe W/cm &/or Wo Cm  Result Date: 03/22/2018 CLINICAL DATA:  Epigastric pain for 15 hours. Mid chest pain. Elevated white  cell count. EXAM: CT ANGIOGRAPHY CHEST CT ABDOMEN AND PELVIS WITH CONTRAST TECHNIQUE: Multidetector CT imaging of the chest was performed using the standard protocol during bolus administration of intravenous contrast. Multiplanar CT image reconstructions and MIPs were obtained to evaluate the vascular anatomy. Multidetector CT imaging of the abdomen and pelvis was performed using the standard protocol during bolus administration of intravenous contrast. CONTRAST:  100mL ISOVUE-370 IOPAMIDOL (ISOVUE-370) INJECTION 76% COMPARISON:  None. FINDINGS: CTA CHEST FINDINGS Cardiovascular: Motion artifact limits examination. There is good opacification of the central and segmental pulmonary arteries. No focal filling defects. No evidence of significant pulmonary embolus. Normal heart size. No pericardial effusion. Normal caliber thoracic aorta. No aortic dissection. Great vessel origins are patent. Mediastinum/Nodes: No significant lymphadenopathy in the chest. Esophagus is decompressed. No abnormal mediastinal gas or fluid collections. Lungs/Pleura: Motion artifact limits examination. There is no evidence of focal consolidation or significant airspace disease. No pleural effusions. No pneumothorax. Airways are patent. Musculoskeletal: Degenerative changes in the spine. No destructive bone lesions. Review of the MIP images confirms the above findings. CT ABDOMEN and PELVIS FINDINGS Hepatobiliary: No focal liver lesions. Homogeneous liver parenchymal pattern. The gallbladder is mildly distended and there is suggestion of pericholecystic edema. No stones are identified but changes may represent acute cholecystitis. No bile duct dilatation. Pancreas: Unremarkable. No pancreatic ductal dilatation or surrounding inflammatory changes. Spleen: Normal in size without focal abnormality. Adrenals/Urinary Tract: No adrenal gland nodules. Renal nephrograms are symmetrical. Small cyst on the left kidney. No hydronephrosis or hydroureter.  Bladder is unremarkable. Stomach/Bowel: Stomach, small bowel, and colon are not abnormally distended. Scattered stool throughout the colon. No wall thickening is appreciated. Appendix is normal. Vascular/Lymphatic: No significant vascular findings are present. No enlarged abdominal or pelvic lymph nodes. Reproductive: Uterus and bilateral adnexa are unremarkable. Other: No abdominal wall hernia or abnormality. No abdominopelvic ascites. Musculoskeletal: No acute or significant osseous findings. Review of the MIP images confirms the above findings. IMPRESSION: 1. No evidence of significant pulmonary embolus. 2. No evidence of active pulmonary disease. 3. Distended gallbladder with suggestion of pericholecystic edema. This could indicate acute cholecystitis. Consider ultrasound for further evaluation. 4. No evidence of bowel obstruction or inflammation. Electronically Signed   By: Burman NievesWilliam  Stevens M.D.   On: 03/22/2018 23:36   Ct Abdomen Pelvis W Contrast  Result Date: 03/22/2018 CLINICAL DATA:  Epigastric pain for 15 hours. Mid chest pain. Elevated white cell count. EXAM: CT ANGIOGRAPHY CHEST CT ABDOMEN AND PELVIS WITH CONTRAST TECHNIQUE: Multidetector CT imaging of the chest was performed using the standard protocol during bolus administration of intravenous contrast. Multiplanar CT image reconstructions and MIPs were obtained to evaluate the vascular anatomy. Multidetector CT imaging of the abdomen and pelvis was performed using the standard protocol during bolus administration of intravenous contrast. CONTRAST:  100mL ISOVUE-370 IOPAMIDOL (ISOVUE-370) INJECTION 76% COMPARISON:  None. FINDINGS: CTA CHEST FINDINGS Cardiovascular: Motion artifact  limits examination. There is good opacification of the central and segmental pulmonary arteries. No focal filling defects. No evidence of significant pulmonary embolus. Normal heart size. No pericardial effusion. Normal caliber thoracic aorta. No aortic dissection. Great  vessel origins are patent. Mediastinum/Nodes: No significant lymphadenopathy in the chest. Esophagus is decompressed. No abnormal mediastinal gas or fluid collections. Lungs/Pleura: Motion artifact limits examination. There is no evidence of focal consolidation or significant airspace disease. No pleural effusions. No pneumothorax. Airways are patent. Musculoskeletal: Degenerative changes in the spine. No destructive bone lesions. Review of the MIP images confirms the above findings. CT ABDOMEN and PELVIS FINDINGS Hepatobiliary: No focal liver lesions. Homogeneous liver parenchymal pattern. The gallbladder is mildly distended and there is suggestion of pericholecystic edema. No stones are identified but changes may represent acute cholecystitis. No bile duct dilatation. Pancreas: Unremarkable. No pancreatic ductal dilatation or surrounding inflammatory changes. Spleen: Normal in size without focal abnormality. Adrenals/Urinary Tract: No adrenal gland nodules. Renal nephrograms are symmetrical. Small cyst on the left kidney. No hydronephrosis or hydroureter. Bladder is unremarkable. Stomach/Bowel: Stomach, small bowel, and colon are not abnormally distended. Scattered stool throughout the colon. No wall thickening is appreciated. Appendix is normal. Vascular/Lymphatic: No significant vascular findings are present. No enlarged abdominal or pelvic lymph nodes. Reproductive: Uterus and bilateral adnexa are unremarkable. Other: No abdominal wall hernia or abnormality. No abdominopelvic ascites. Musculoskeletal: No acute or significant osseous findings. Review of the MIP images confirms the above findings. IMPRESSION: 1. No evidence of significant pulmonary embolus. 2. No evidence of active pulmonary disease. 3. Distended gallbladder with suggestion of pericholecystic edema. This could indicate acute cholecystitis. Consider ultrasound for further evaluation. 4. No evidence of bowel obstruction or inflammation.  Electronically Signed   By: Burman NievesWilliam  Stevens M.D.   On: 03/22/2018 23:36   [place] Abdomen Limited Ruq  Result Date: 03/23/2018 CLINICAL DATA:  Epigastric abdominal pain since yesterday. EXAM: ULTRASOUND ABDOMEN LIMITED RIGHT UPPER QUADRANT COMPARISON:  CT abdomen and pelvis 03/22/2018 FINDINGS: Gallbladder: Cholelithiasis and gallbladder sludge. Gallbladder neck stone measures 1.3 cm. Mild pericholecystic edema without significant wall thickening. Murphy's sign is indeterminate due to patient medication. Common bile duct: Diameter: 4.2 mm, normal Liver: Diffusely increased liver parenchymal echotexture suggesting fatty infiltration. No focal liver lesions demonstrated. Portal vein is patent on color Doppler imaging with normal direction of blood flow towards the liver. IMPRESSION: Cholelithiasis and sludge in the gallbladder. Mild pericholecystic edema without wall thickening. Changes are nonspecific but may indicate acute cholecystitis in the appropriate clinical setting. Diffuse fatty infiltration of the liver. Electronically Signed   By: Burman NievesWilliam  Stevens M.D.   On: 03/23/2018 03:54      Delphin Funes M 03/24/2018  Patient ID: Kathryn Christian, female   DOB: 01/10/1956, 62 y.o.   MRN: [id number]

## 2018-03-24 NOTE — Progress Notes (Signed)
Inpatient Diabetes Program Recommendations  AACE/ADA: New Consensus Statement on Inpatient Glycemic Control (2015)  Target Ranges:  Prepandial:   less than 140 mg/dL      Peak postprandial:   less than 180 mg/dL (1-2 hours)      Critically ill patients:  140 - 180 mg/dL   Results for Janae BridgemanKHAN, Maryln (MRN [id number]) as of 03/24/2018 09:21  Ref. Range 03/23/2018 07:53 03/23/2018 11:09 03/23/2018 14:17 03/23/2018 15:07 03/23/2018 16:38 03/23/2018 23:10 03/24/2018 07:45  Glucose-Capillary Latest Ref Range: 70 - 99 mg/dL 409253 (H) 811194 (H) 914253 (H) 260 (H) 258 (H) 239 (H) 243 (H)   Review of Glycemic Control  Diabetes history: DM 2 Outpatient Diabetes medications: Metformin 500 mg BID Current orders for Inpatient glycemic control: Novolog 0-9 units tid, Novolog 0-5 units qhs  Inpatient Diabetes Program Recommendations:    Glucose trends consistently in the 200 range. Decadron 10 mg given during surgery. If patient is not discharged, consider Lantus 10 units (0.1 units/kg).  Will follow trends while inpatient.  Thanks,  Christena DeemShannon Zanya Lindo RN, MSN, BC-ADM Inpatient Diabetes Coordinator Team Pager [phone] (8a-5p)

## 2018-03-24 NOTE — Discharge Instructions (Signed)
Information on my medicine - XARELTO (rivaroxaban)  This medication education was reviewed with me or my healthcare representative as part of my discharge preparation.  The pharmacist that spoke with me during my hospital stay was:  Ivery Qualeaylor  A Lan Entsminger, Student-PharmD  WHY WAS XARELTO PRESCRIBED FOR YOU? Xarelto was prescribed to treat blood clots that may have been found in the veins of your legs (deep vein thrombosis) or in your lungs (pulmonary embolism) and to reduce the risk of them occurring again.  What do you need to know about Xarelto? The starting dose is one 15 mg tablet taken TWICE daily with food for the FIRST 21 DAYS then on Monday, September 9th the dose is changed to one 20 mg tablet taken ONCE A DAY with your evening meal.  DO NOT stop taking Xarelto without talking to the health care provider who prescribed the medication.  Refill your prescription for 20 mg tablets before you run out.  After discharge, you should have regular check-up appointments with your healthcare provider that is prescribing your Xarelto.  In the future your dose may need to be changed if your kidney function changes by a significant amount.  What do you do if you miss a dose? If you are taking Xarelto TWICE DAILY and you miss a dose, take it as soon as you remember. You may take two 15 mg tablets (total 30 mg) at the same time then resume your regularly scheduled 15 mg twice daily the next day.  If you are taking Xarelto ONCE DAILY and you miss a dose, take it as soon as you remember on the same day then continue your regularly scheduled once daily regimen the next day. Do not take two doses of Xarelto at the same time.   Important Safety Information Xarelto is a blood thinner medicine that can cause bleeding. You should call your healthcare provider right away if you experience any of the following: ? Bleeding from an injury or your nose that does not stop. ? Unusual colored urine (red or dark  brown) or unusual colored stools (red or black). ? Unusual bruising for unknown reasons. ? A serious fall or if you hit your head (even if there is no bleeding).  Some medicines may interact with Xarelto and might increase your risk of bleeding while on Xarelto. To help avoid this, consult your healthcare provider or pharmacist prior to using any new prescription or non-prescription medications, including herbals, vitamins, non-steroidal anti-inflammatory drugs (NSAIDs) and supplements.  This website has more information on Xarelto: [email or web address].  [facility] ______[facility] LAPAROSCOPIC SURGERY: POST OP INSTRUCTIONS Always review your discharge instruction sheet given to you by the facility where your surgery was performed. IF YOU HAVE DISABILITY OR FAMILY LEAVE FORMS, YOU MUST BRING THEM TO THE OFFICE FOR PROCESSING.   DO NOT GIVE THEM TO YOUR DOCTOR.  1. A prescription for pain medication may be given to you upon discharge.  Take your pain medication as prescribed, if needed.  If narcotic pain medicine is not needed, then you may take acetaminophen (Tylenol) or ibuprofen (Advil) as needed. 2. Take your usually prescribed medications unless otherwise directed. 3. If you need a refill on your pain medication, please contact your pharmacy.  They will contact our office to request authorization. Prescriptions will not be filled after 5pm or on week-ends. 4. You should follow a light diet the first few days after arrival home, such as soup and crackers, etc.  Be sure to include  lots of fluids daily. 5. Most patients will experience some swelling and bruising in the area of the incisions.  Ice packs will help.  Swelling and bruising can take several days to resolve.  6. It is common to experience some constipation if taking pain medication after surgery.  Increasing fluid intake and taking a stool softener (such as Colace) will usually help or prevent this problem from occurring.   A mild laxative (Milk of Magnesia or Miralax) should be taken according to package instructions if there are no bowel movements after 48 hours. 7. Unless discharge instructions indicate otherwise, you may remove your bandages 24-48 hours after surgery, and you may shower at that time.  You may have steri-strips (small skin tapes) in place directly over the incision.  These strips should be left on the skin for 7-10 days.  If your surgeon used skin glue on the incision, you may shower in 24 hours.  The glue will flake off over the next 2-3 weeks.  Any sutures or staples will be removed at the office during your follow-up visit. 8. ACTIVITIES:  You may resume regular (light) daily activities beginning the next day--such as daily self-care, walking, climbing stairs--gradually increasing activities as tolerated.  You may have sexual intercourse when it is comfortable.  Refrain from any heavy lifting or straining until approved by your doctor. a. You may drive when you are no longer taking prescription pain medication, you can comfortably wear a seatbelt, and you can safely maneuver your car and apply brakes. b. RETURN TO WORK:  __________________________________________________________ 9. You should see your doctor in the office for a follow-up appointment approximately 2-3 weeks after your surgery.  Make sure that you call for this appointment within a day or two after you arrive home to insure a convenient appointment time. 10. OTHER INSTRUCTIONS: __________________________________________________________________________________________________________________________ __________________________________________________________________________________________________________________________ WHEN TO CALL YOUR DOCTOR: 1. Fever over 101.0 2. Inability to urinate 3. Continued bleeding from incision. 4. Increased pain, redness, or drainage from the incision. 5. Increasing abdominal pain  The clinic staff is  available to answer your questions during regular business hours.  Please dont hesitate to call and ask to speak to one of the nurses for clinical concerns.  If you have a medical emergency, go to the nearest emergency room or call 911.  A surgeon from [facility] is always on call at the hospital. [address], [address], [place], [place]  [id number] ? [address] [address], [place], [place]   [id number] [phone] ? [phone] ? FAX [phone] Web site: [email or web address]

## 2018-03-24 NOTE — Discharge Summary (Signed)
Physician Discharge Summary  Kathryn BridgemanSughra Christian HQI:[id number]:[id number] DOB: 07/04/1956 DOA: 03/22/2018  PCP: [facility]  Admit date: 03/22/2018 Discharge date: 03/24/2018  Admitted From: Home  Disposition:  Home   Recommendations for Outpatient Follow-up and new medication changes:  1. [facility].  2. Patient placed on Rivaroxaban for right lower extremity DVT  Home Health: no   Equipment/Devices: no    Discharge Condition: stable  CODE STATUS: full  Diet recommendation: Heart healthy   Brief/Interim Summary: 62 year old female who presented with epigastric abdominal pain.  She does have the significant past medical history for hypertension, dyslipidemia and type 2 diabetes mellitus.  Reported acute epigastric abdominal pain, sudden in onset, sharp in nature, radiated to her chest, and associated with vomiting.  On the initial physical examination temperature 100.8, heart rate 74-101, respiratory rate 17-24, blood pressure 148/72, oxygen saturation 90 to 98%.  Moist mucous membranes, lungs clear to auscultation, heart S1-S2 present and rhythmic, abdomen with right upper quadrant tenderness to deep palpation, no masses, no rebound or guarding, positive left lower extremity edema, nonpitting.  Sodium 130, potassium 3.7, chloride 93, bicarb 25, glucose 255, BUN 11, creatinine 0.58, AST 31, ALT 34, lipase 24, total bilirubin 0.4, white count 17.7, hemoglobin 14.7, hematocrit 43.6, platelets 236, d dimer was 0.69, urinalysis specific gravity less than 1.005, CT of the chest and abdomen, no pulmonary embolism, distended gallbladder with suggestion of pericholecystic edema.  Chest x-ray negative for infiltrates.  EKG normal sinus rhythm, normal axis, normal intervals.  Patient was admitted to the hospital with the working diagnosis of sepsis due to acute cholecystitis.  1. Sepsis due to acute cholecystitis/ present on admission. Patient was admitted to the medical unit,  placed on a remote telemetry monitor, IV fluids and IV antibiotic therapy with ceftriaxone and metronidazole.  Patient remained hemodynamically stable, and underwent laparoscopic cholecystectomy.  Has remained afebrile.  Patient has been stable to be discharged home.  2.  Right lower extremity deep vein thrombosis.  Ultrasonography of the right lower extremity showed obstruction in the posterior tibial veins and peroneal veins, unclear if acute or chronic condition.  No evidence of deep vein thrombosis on the left lower extremity.  Limited study.  Considering the patient's symptoms of lower extremity edema, pain and elevated d-dimer, decision was made to treat patient with anticoagulation, rivaroxaban, follow-up as an outpatient.  To consider repeat ultrasonography in 3 months.  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  3.  Type 2 diabetes mellitus with uncontrolled hyperglycemia.  Patient initially was kept nothing by mouth, she was placed on insulin sliding scale for glucose coverage and monitoring, she did receive Decadron intraoperatively with consequent hyperglycemia.  At discharge she will resume metformin.  4.  Hypertension.  Patient was continued on hydrochlorothiazide for pressure control.  5.  Dyslipidemia.  Continue atorvastatin.  6.  Obesity.  Calculated BMI 37, outpatient follow-up.  7.  Reactive leukocytosis.  Patient received intraoperative dexamethasone 10 mg, developing leukocytosis, discharge white cell count 18.2, no signs of systemic infection, follow-up as an outpatient.  Discharge Diagnoses:  Principal Problem:   Epigastric abdominal pain Active Problems:   Cholecystitis   Left leg pain   Obesity (BMI 30-39.9)   SIRS (systemic inflammatory response syndrome) (HCC)    Discharge Instructions   Allergies as of 03/24/2018   No Known Allergies     Medication List    TAKE these  medications  acetaminophen 325 MG tablet Commonly known as:  TYLENOL Take 2 tablets (650 mg total) by mouth every 6 (six) hours as needed for mild pain (or Fever >/= 101).   atorvastatin 10 MG tablet Commonly known as:  LIPITOR Take 10 mg by mouth daily.   hydrochlorothiazide 25 MG tablet Commonly known as:  HYDRODIURIL Take 25 mg by mouth daily.   metFORMIN 500 MG 24 hr tablet Commonly known as:  GLUCOPHAGE-XR Take 1 tablet (500 mg total) by mouth daily.   Rivaroxaban 15 & 20 MG Tbpk Take as directed on package: Start with one 15mg  tablet by mouth twice a day with food. On Day 22, switch to one 20mg  tablet once a day with food.      Follow-up Information    [facility] Follow up on 04/04/2018.   Specialty:  General Surgery Why:  Your appointment is at  11:30 AM.  Be at the office 30 minutes early for check-in.  Bring photo ID and insurance information. Contact information: [address] [place] [address] [phone]        [facility] Follow up.   Why:  Follow-up for diabetes, hypertension, and hyperlipidemia.:  Call and let them know you have had surgery. Contact information: [address] [place] [address] [phone]        [facility], [place]. Schedule an appointment as soon as possible for a visit in 2 weeks.   Specialty:  General Surgery Why:  For wound re-check Contact information: [address] [place] [address] [phone]         No Known Allergies  Consultations:  Surgery    Procedures/Studies: Dg Chest 2 View  Result Date: 03/22/2018 CLINICAL DATA:  Chest pain EXAM: CHEST - 2 VIEW COMPARISON:  None. FINDINGS: Mildly low lung volume. No acute airspace disease or effusion. Normal heart size. No pneumothorax. IMPRESSION: No active cardiopulmonary disease.  Low lung volume. Electronically Signed   By: Jasmine Pang M.D.   On: 03/22/2018 21:36    Dg Cholangiogram Operative  Result Date: 03/23/2018 CLINICAL DATA:  Cholecystectomy for cholelithiasis and acute cholecystitis. EXAM: INTRAOPERATIVE CHOLANGIOGRAM TECHNIQUE: Cholangiographic images from the C-arm fluoroscopic device were submitted for interpretation post-operatively. Please see the procedural report for the amount of contrast and the fluoroscopy time utilized. COMPARISON:  Right upper quadrant abdominal ultrasound on 03/23/2018 FINDINGS: Intraoperative imaging with a C-arm demonstrates a normal opacified biliary tree without evidence of biliary obstruction or filling defect. Contrast enters the duodenum normally. IMPRESSION: Normal intraoperative cholangiogram. Electronically Signed   By: Irish Lack M.D.   On: 03/23/2018 16:04   Ct Angio Chest Pe W/cm &/or Wo Cm  Result Date: 03/22/2018 CLINICAL DATA:  Epigastric pain for 15 hours. Mid chest pain. Elevated white cell count. EXAM: CT ANGIOGRAPHY CHEST CT ABDOMEN AND PELVIS WITH CONTRAST TECHNIQUE: Multidetector CT imaging of the chest was performed using the standard protocol during bolus administration of intravenous contrast. Multiplanar CT image reconstructions and MIPs were obtained to evaluate the vascular anatomy. Multidetector CT imaging of the abdomen and pelvis was performed using the standard protocol during bolus administration of intravenous contrast. CONTRAST:  ISOVUE-370 IOPAMIDOL (ISOVUE-370) INJECTION 76% COMPARISON:  None. FINDINGS: CTA CHEST FINDINGS Cardiovascular: Motion artifact limits examination. There is good opacification of the central and segmental pulmonary arteries. No focal filling defects. No evidence of significant pulmonary embolus. Normal heart size. No pericardial effusion. Normal caliber thoracic aorta. No aortic  dissection. Great vessel origins are patent. Mediastinum/Nodes: No significant lymphadenopathy in the chest. Esophagus is decompressed. No abnormal mediastinal gas or fluid  collections. Lungs/Pleura: Motion artifact limits examination. There is no evidence of focal consolidation or significant airspace disease. No pleural effusions. No pneumothorax. Airways are patent. Musculoskeletal: Degenerative changes in the spine. No destructive bone lesions. Review of the MIP images confirms the above findings. CT ABDOMEN and PELVIS FINDINGS Hepatobiliary: No focal liver lesions. Homogeneous liver parenchymal pattern. The gallbladder is mildly distended and there is suggestion of pericholecystic edema. No stones are identified but changes may represent acute cholecystitis. No bile duct dilatation. Pancreas: Unremarkable. No pancreatic ductal dilatation or surrounding inflammatory changes. Spleen: Normal in size without focal abnormality. Adrenals/Urinary Tract: No adrenal gland nodules. Renal nephrograms are symmetrical. Small cyst on the left kidney. No hydronephrosis or hydroureter. Bladder is unremarkable. Stomach/Bowel: Stomach, small bowel, and colon are not abnormally distended. Scattered stool throughout the colon. No wall thickening is appreciated. Appendix is normal. Vascular/Lymphatic: No significant vascular findings are present. No enlarged abdominal or pelvic lymph nodes. Reproductive: Uterus and bilateral adnexa are unremarkable. Other: No abdominal wall hernia or abnormality. No abdominopelvic ascites. Musculoskeletal: No acute or significant osseous findings. Review of the MIP images confirms the above findings. IMPRESSION: 1. No evidence of significant pulmonary embolus. 2. No evidence of active pulmonary disease. 3. Distended gallbladder with suggestion of pericholecystic edema. This could indicate acute cholecystitis. Consider ultrasound for further evaluation. 4. No evidence of bowel obstruction or inflammation. Electronically Signed   By: Burman NievesWilliam  Stevens M.D.   On: 03/22/2018 23:36   Ct Abdomen Pelvis W Contrast  Result Date: 03/22/2018 CLINICAL DATA:  Epigastric pain  for 15 hours. Mid chest pain. Elevated white cell count. EXAM: CT ANGIOGRAPHY CHEST CT ABDOMEN AND PELVIS WITH CONTRAST TECHNIQUE: Multidetector CT imaging of the chest was performed using the standard protocol during bolus administration of intravenous contrast. Multiplanar CT image reconstructions and MIPs were obtained to evaluate the vascular anatomy. Multidetector CT imaging of the abdomen and pelvis was performed using the standard protocol during bolus administration of intravenous contrast. CONTRAST:  100mL ISOVUE-370 IOPAMIDOL (ISOVUE-370) INJECTION 76% COMPARISON:  None. FINDINGS: CTA CHEST FINDINGS Cardiovascular: Motion artifact limits examination. There is good opacification of the central and segmental pulmonary arteries. No focal filling defects. No evidence of significant pulmonary embolus. Normal heart size. No pericardial effusion. Normal caliber thoracic aorta. No aortic dissection. Great vessel origins are patent. Mediastinum/Nodes: No significant lymphadenopathy in the chest. Esophagus is decompressed. No abnormal mediastinal gas or fluid collections. Lungs/Pleura: Motion artifact limits examination. There is no evidence of focal consolidation or significant airspace disease. No pleural effusions. No pneumothorax. Airways are patent. Musculoskeletal: Degenerative changes in the spine. No destructive bone lesions. Review of the MIP images confirms the above findings. CT ABDOMEN and PELVIS FINDINGS Hepatobiliary: No focal liver lesions. Homogeneous liver parenchymal pattern. The gallbladder is mildly distended and there is suggestion of pericholecystic edema. No stones are identified but changes may represent acute cholecystitis. No bile duct dilatation. Pancreas: Unremarkable. No pancreatic ductal dilatation or surrounding inflammatory changes. Spleen: Normal in size without focal abnormality. Adrenals/Urinary Tract: No adrenal gland nodules. Renal nephrograms are symmetrical. Small cyst on the  left kidney. No hydronephrosis or hydroureter. Bladder is unremarkable. Stomach/Bowel: Stomach, small bowel, and colon are not abnormally distended. Scattered stool throughout the colon. No wall thickening is appreciated. Appendix is normal. Vascular/Lymphatic: No significant vascular findings are present. No enlarged abdominal or pelvic lymph nodes. Reproductive: Uterus  and bilateral adnexa are unremarkable. Other: No abdominal wall hernia or abnormality. No abdominopelvic ascites. Musculoskeletal: No acute or significant osseous findings. Review of the MIP images confirms the above findings. IMPRESSION: 1. No evidence of significant pulmonary embolus. 2. No evidence of active pulmonary disease. 3. Distended gallbladder with suggestion of pericholecystic edema. This could indicate acute cholecystitis. Consider ultrasound for further evaluation. 4. No evidence of bowel obstruction or inflammation. Electronically Signed   By: Burman Nieves M.D.   On: 03/22/2018 23:36   US Abdomen Limited Ruq  Result Date: 03/23/2018 CLINICAL DATA:  Epigastric abdominal pain since yesterday. EXAM: ULTRASOUND ABDOMEN LIMITED RIGHT UPPER QUADRANT COMPARISON:  CT abdomen and pelvis 03/22/2018 FINDINGS: Gallbladder: Cholelithiasis and gallbladder sludge. Gallbladder neck stone measures 1.3 cm. Mild pericholecystic edema without significant wall thickening. Murphy's sign is indeterminate due to patient medication. Common bile duct: Diameter: 4.2 mm, normal Liver: Diffusely increased liver parenchymal echotexture suggesting fatty infiltration. No focal liver lesions demonstrated. Portal vein is patent on color Doppler imaging with normal direction of blood flow towards the liver. IMPRESSION: Cholelithiasis and sludge in the gallbladder. Mild pericholecystic edema without wall thickening. Changes are nonspecific but may indicate acute cholecystitis in the appropriate clinical setting. Diffuse fatty infiltration of the liver.  Electronically Signed   By: Burman Nieves M.D.   On: 03/23/2018 03:54       Subjective: Patient is feeling better, tolerating po well, no dyspnea.   Discharge Exam: Vitals:   03/23/18 2315 03/24/18 0500  BP: 134/75 (!) 141/75  Pulse: 72 64  Resp: 18 18  Temp: 97.8 F (36.6 C) (!) 97.4 F (36.3 C)  SpO2: 93% 93%   Vitals:   03/23/18 1530 03/23/18 1540 03/23/18 2315 03/24/18 0500  BP: 121/71 134/72 134/75 (!) 141/75  Pulse: 91 88 72 64  Resp: 16 16 18 18   Temp:  99.2 F (37.3 C) 97.8 F (36.6 C) (!) 97.4 F (36.3 C)  TempSrc:  Oral Oral Oral  SpO2: 98% 93% 93% 93%  Weight:      Height:        General: Not in pain or dyspnea  Neurology: Awake and alert, non focal  E ENT: no pallor, no icterus, oral mucosa moist Cardiovascular: No JVD. S1-S2 present, rhythmic, no gallops, rubs, or murmurs. Non pitting bilateral lower extremity edema. Pulmonary: vesicular breath sounds bilaterally, adequate air movement, no wheezing, rhonchi or rales. Gastrointestinal. Abdomen with no organomegaly, non tender, no rebound or guarding Skin. No rashes Musculoskeletal: no joint deformities   The results of significant diagnostics from this hospitalization (including imaging, microbiology, ancillary and laboratory) are listed below for reference.     Microbiology: Recent Results (from the past 240 hour(s))  Surgical pcr screen     Status: None   Collection Time: 03/23/18 10:14 AM  Result Value Ref Range Status   MRSA, PCR NEGATIVE NEGATIVE Final   Staphylococcus aureus NEGATIVE NEGATIVE Final    Comment: (NOTE) The Xpert SA Assay (FDA approved for NASAL specimens in patients 48 years of age and older), is one component of a comprehensive surveillance program. It is not intended to diagnose infection nor to guide or monitor treatment. Performed at [facility], [address] [place], [place] [address]      Labs: BNP (last 3 results) No results for  input(s): BNP in the last 8760 hours. Basic Metabolic Panel: Recent Labs  Lab 03/22/18 2116 03/23/18 0536 03/24/18 0540  NA 130* 137 139  K 3.7 3.8  4.1  CL 93* 97* 103  CO2 25 29 29   GLUCOSE 255* 260* 236*  BUN 11 9 12   CREATININE 0.58 0.58 0.66  CALCIUM 8.6* 8.5* 8.8*   Liver Function Tests: Recent Labs  Lab 03/22/18 2116 03/23/18 0536  AST 31 25  ALT 34 30  ALKPHOS 80 71  BILITOT 0.4 0.6  PROT 7.1 6.5  ALBUMIN 3.8 3.2*   Recent Labs  Lab 03/22/18 2116  LIPASE 24   No results for input(s): AMMONIA in the last 168 hours. CBC: Recent Labs  Lab 03/22/18 2054 03/23/18 0536 03/24/18 0540  WBC 17.7* 13.9* 18.2*  NEUTROABS 15.7*  --  16.5*  HGB 14.7 13.8 13.4  HCT 43.6 41.4 41.4  MCV 76.0* 77.1* 79.5  PLT 236 233 232   Cardiac Enzymes: Recent Labs  Lab 03/22/18 2054  TROPONINI <0.03   BNP: Invalid input(s): POCBNP CBG: Recent Labs  Lab 03/23/18 1417 03/23/18 1507 03/23/18 1638 03/23/18 2310 03/24/18 0745  GLUCAP 253* 260* 258* 239* 243*   D-Dimer Recent Labs    03/23/18 0727  DDIMER 0.69*   Hgb A1c No results for input(s): HGBA1C in the last 72 hours. Lipid Profile No results for input(s): CHOL, HDL, LDLCALC, TRIG, CHOLHDL, LDLDIRECT in the last 72 hours. Thyroid function studies No results for input(s): TSH, T4TOTAL, T3FREE, THYROIDAB in the last 72 hours.  Invalid input(s): FREET3 Anemia work up No results for input(s): VITAMINB12, FOLATE, FERRITIN, TIBC, IRON, RETICCTPCT in the last 72 hours. Urinalysis    Component Value Date/Time   COLORURINE YELLOW 03/23/2018 0045   APPEARANCEUR CLEAR 03/23/2018 0045   LABSPEC <1.005 (L) 03/23/2018 0045   PHURINE 7.0 03/23/2018 0045   GLUCOSEU 250 (A) 03/23/2018 0045   HGBUR NEGATIVE 03/23/2018 0045   BILIRUBINUR NEGATIVE 03/23/2018 0045   KETONESUR NEGATIVE 03/23/2018 0045   PROTEINUR NEGATIVE 03/23/2018 0045   NITRITE NEGATIVE 03/23/2018 0045   LEUKOCYTESUR NEGATIVE 03/23/2018 0045    Sepsis Labs Invalid input(s): PROCALCITONIN,  WBC,  LACTICIDVEN Microbiology Recent Results (from the past 240 hour(s))  Surgical pcr screen     Status: None   Collection Time: 03/23/18 10:14 AM  Result Value Ref Range Status   MRSA, PCR NEGATIVE NEGATIVE Final   Staphylococcus aureus NEGATIVE NEGATIVE Final    Comment: (NOTE) The Xpert SA Assay (FDA approved for NASAL specimens in patients 48 years of age and older), is one component of a comprehensive surveillance program. It is not intended to diagnose infection nor to guide or monitor treatment. Performed at [facility], [address] [place], [place] [address]      Time coordinating discharge: 45 minutes  SIGNED:   Coralie Keens, MD  [facility] 03/24/2018, 12:20 PM Pager [phone]  If 7PM-7AM, please contact night-coverage [email or web address] Password [email or web address]

## 2018-04-03 DIAGNOSIS — E559 Vitamin D deficiency, unspecified: Secondary | ICD-10-CM | POA: Insufficient documentation

## 2018-04-03 DIAGNOSIS — E7800 Pure hypercholesterolemia, unspecified: Secondary | ICD-10-CM | POA: Insufficient documentation

## 2018-04-03 DIAGNOSIS — E119 Type 2 diabetes mellitus without complications: Secondary | ICD-10-CM | POA: Insufficient documentation

## 2019-01-23 DIAGNOSIS — Z86718 Personal history of other venous thrombosis and embolism: Secondary | ICD-10-CM | POA: Insufficient documentation

## 2019-03-08 IMAGING — CT CT ABD-PELV W/ CM
3 of 12 series · 11 of 46 positions shown, 17 images · IV contrast (iopamidol)
Comparison: None.

CLINICAL DATA: Epigastric pain for 15 hours. Mid chest pain.
Elevated white cell count.

EXAM:
CT ANGIOGRAPHY CHEST
CT ABDOMEN AND PELVIS WITH CONTRAST
TECHNIQUE: Multidetector CT imaging of the chest was performed using the
standard protocol during bolus administration of intravenous
contrast. Multiplanar CT image reconstructions and MIPs were
obtained to evaluate the vascular anatomy. Multidetector CT imaging
of the abdomen and pelvis was performed using the standard protocol
during bolus administration of intravenous contrast.
CONTRAST:  100mL NCJE3C-0ND IOPAMIDOL (NCJE3C-0ND) INJECTION 76%

[Series 6: pe coronal mpr · coronal · 0.49mm/px · 1 of 116 slices shown, 2 images]
[im 58/116  soft-tissue]
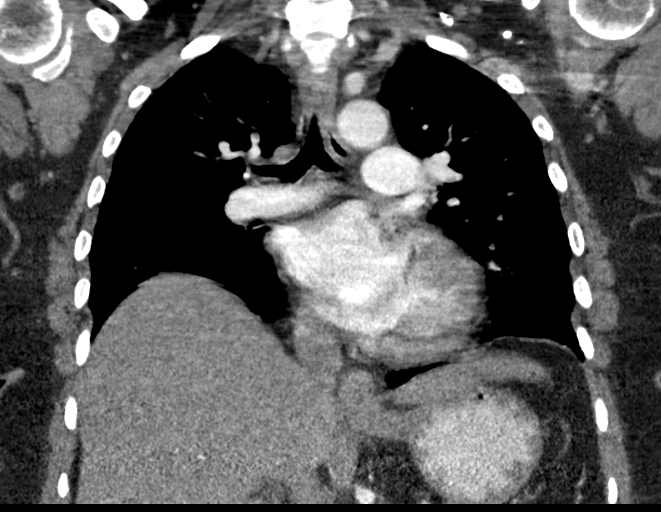
[im 58/116  bone]
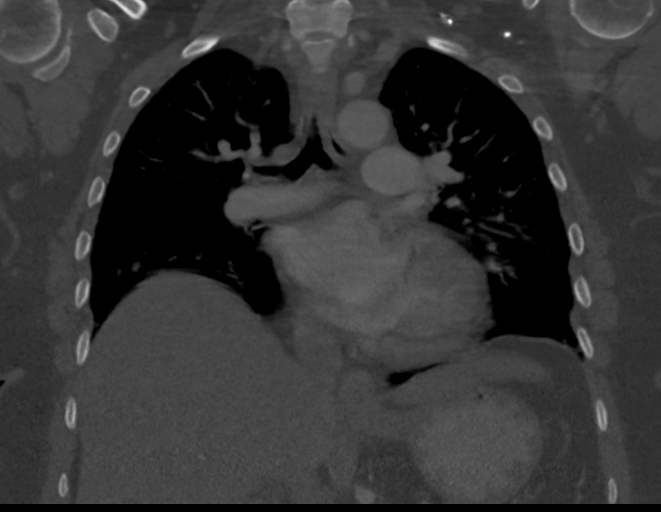

[Series 10: pe thins · axial · 0.77mm/px · z∈[-265,-117]mm · 6 of 248 slices shown]
[im 17/248  soft-tissue]
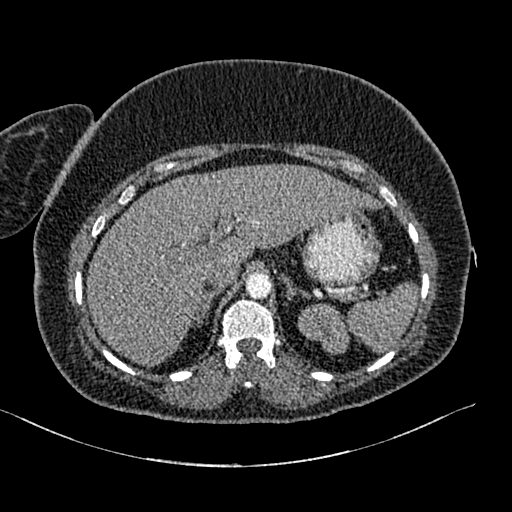
[im 50/248  soft-tissue]
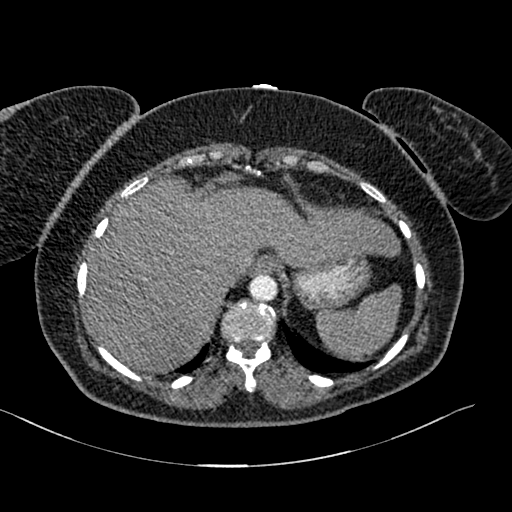
[im 83/248  soft-tissue]
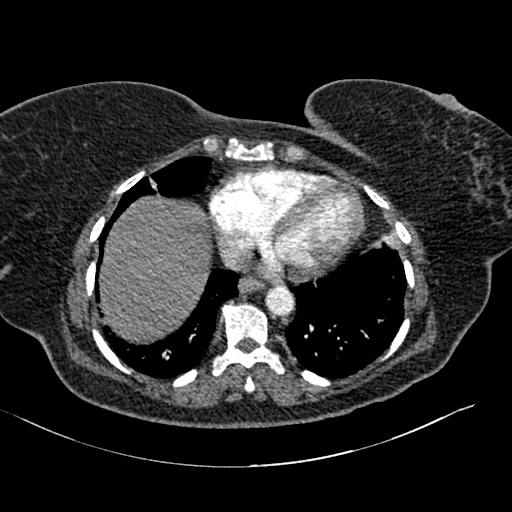
[im 116/248  soft-tissue]
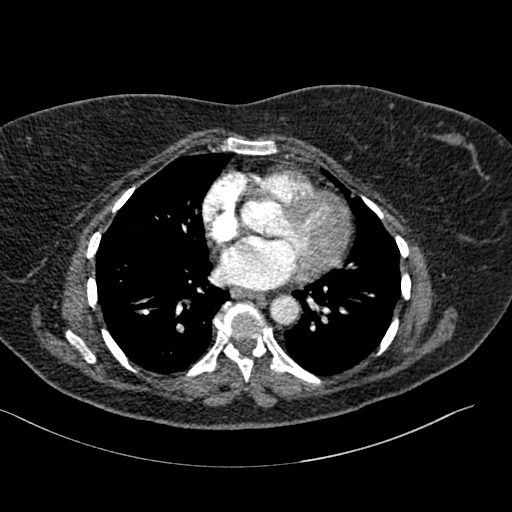
[im 132/248  soft-tissue]
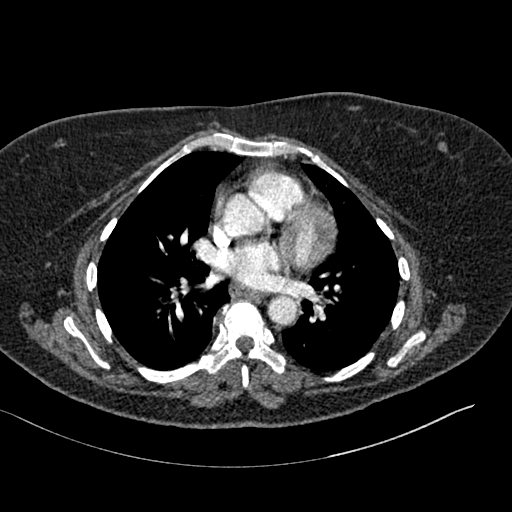
[im 165/248  soft-tissue]
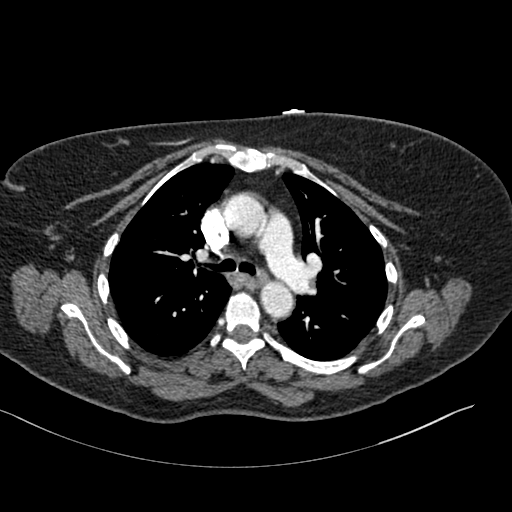

[Series 11: axial st · axial · 0.98mm/px · z∈[-518,-233]mm · 4 of 97 slices shown, 9 images]
[im 20/97  soft-tissue]
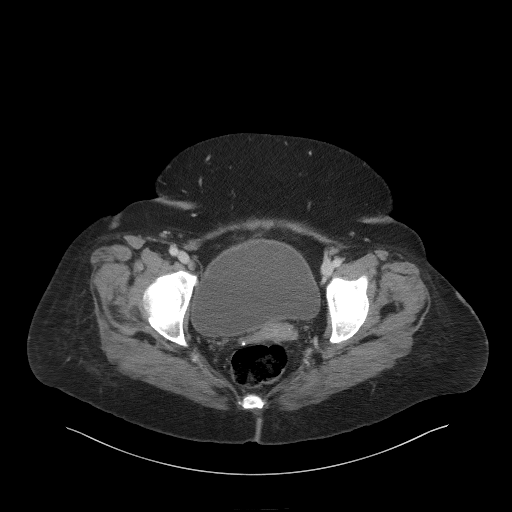
[im 20/97  lung]
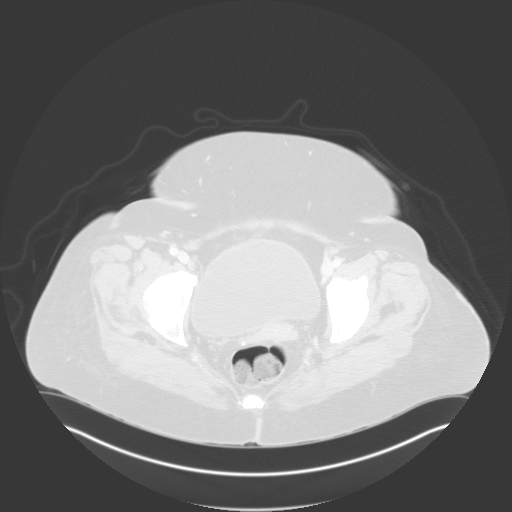
[im 20/97  bone]
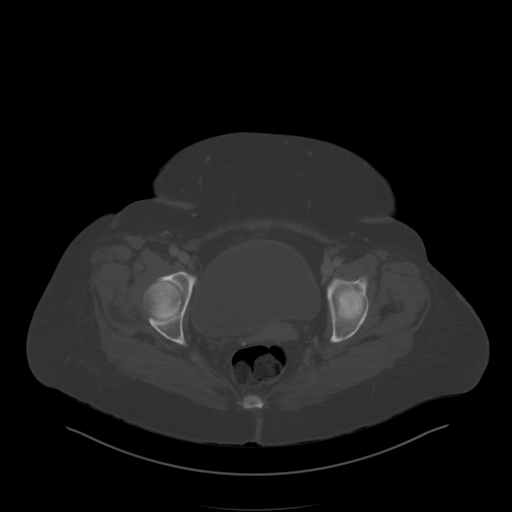
[im 39/97  soft-tissue]
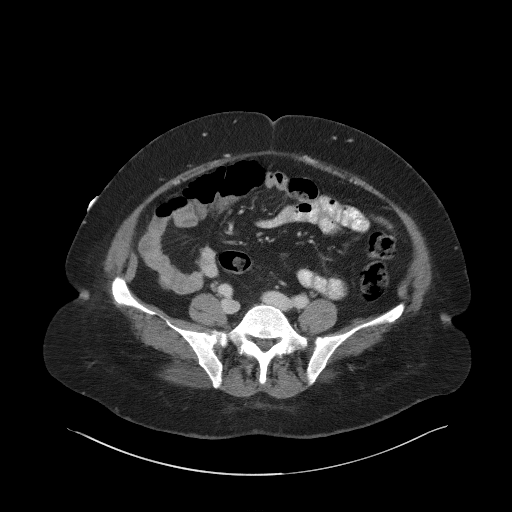
[im 39/97  lung]
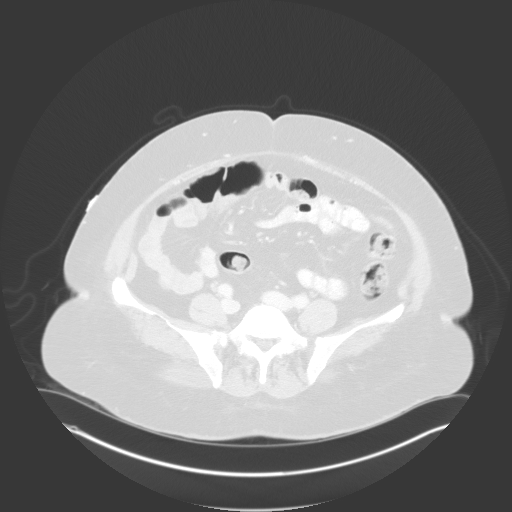
[im 58/97  soft-tissue]
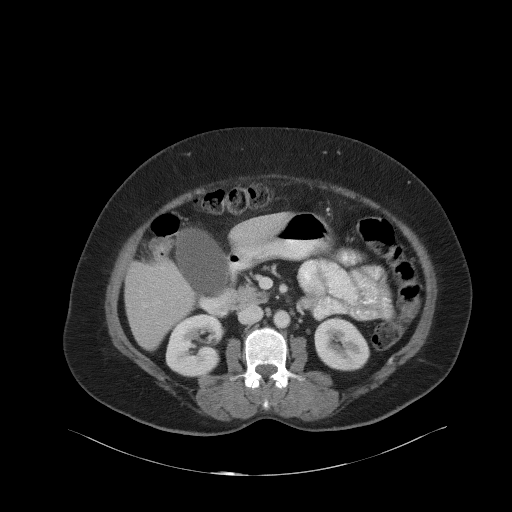
[im 58/97  lung]
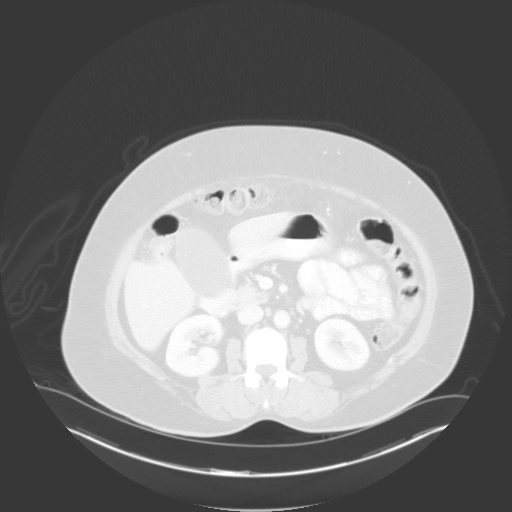
[im 77/97  soft-tissue]
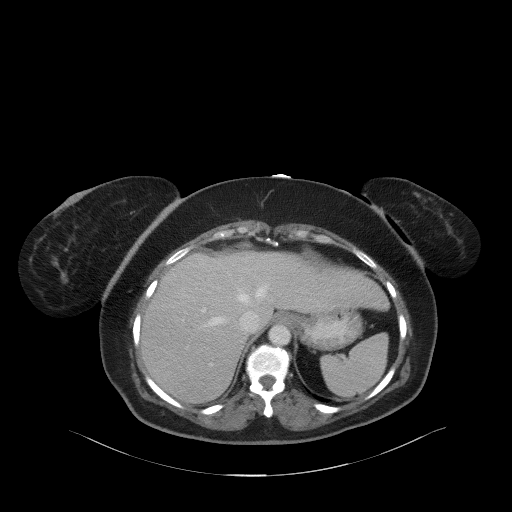
[im 77/97  lung]
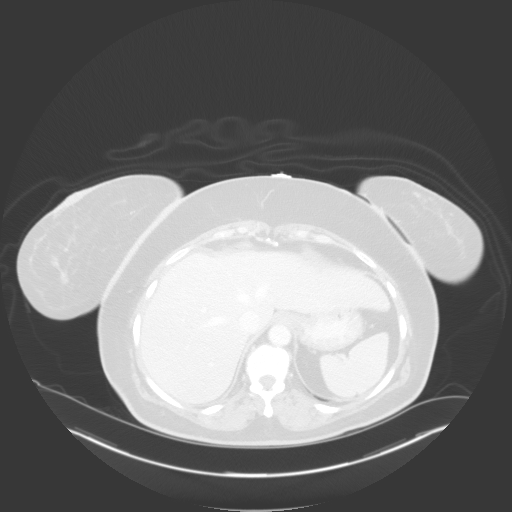

[11 of 46 positions shown; findings below may reference images not displayed]

FINDINGS: CTA CHEST FINDINGS

Cardiovascular: Motion artifact limits examination. There is good
opacification of the central and segmental pulmonary arteries. No
focal filling defects. No evidence of significant pulmonary embolus.
Normal heart size. No pericardial effusion. Normal caliber thoracic
aorta. No aortic dissection. Great vessel origins are patent.

Mediastinum/Nodes: No significant lymphadenopathy in the chest.
Esophagus is decompressed. No abnormal mediastinal gas or fluid
collections.

Lungs/Pleura: Motion artifact limits examination. There is no
evidence of focal consolidation or significant airspace disease. No
pleural effusions. No pneumothorax. Airways are patent.

Musculoskeletal: Degenerative changes in the spine. No destructive
bone lesions.

Review of the MIP images confirms the above findings.

CT ABDOMEN and PELVIS FINDINGS

Hepatobiliary: No focal liver lesions. Homogeneous liver parenchymal
pattern. The gallbladder is mildly distended and there is suggestion
of pericholecystic edema. No stones are identified but changes may
represent acute cholecystitis. No bile duct dilatation.

Pancreas: Unremarkable. No pancreatic ductal dilatation or
surrounding inflammatory changes.

Spleen: Normal in size without focal abnormality.

Adrenals/Urinary Tract: No adrenal gland nodules. Renal nephrograms
are symmetrical. Small cyst on the left kidney. No hydronephrosis or
hydroureter. Bladder is unremarkable.

Stomach/Bowel: Stomach, small bowel, and colon are not abnormally
distended. Scattered stool throughout the colon. No wall thickening
is appreciated. Appendix is normal.

Vascular/Lymphatic: No significant vascular findings are present. No
enlarged abdominal or pelvic lymph nodes.

Reproductive: Uterus and bilateral adnexa are unremarkable.

Other: No abdominal wall hernia or abnormality. No abdominopelvic
ascites.

Musculoskeletal: No acute or significant osseous findings.

Review of the MIP images confirms the above findings.
IMPRESSION: 1. No evidence of significant pulmonary embolus.
2. No evidence of active pulmonary disease.
3. Distended gallbladder with suggestion of pericholecystic edema.
This could indicate acute cholecystitis. Consider ultrasound for
further evaluation.
4. No evidence of bowel obstruction or inflammation.

## 2019-05-01 DIAGNOSIS — I10 Essential (primary) hypertension: Secondary | ICD-10-CM | POA: Insufficient documentation

## 2020-10-07 NOTE — Patient Instructions (Incomplete)
Health Maintenance Due  Topic Date Due  . Hepatitis C Screening  Never done  . COVID-19 Vaccine (1) Never done  . URINE MICROALBUMIN  Never done  . TETANUS/TDAP  Never done  . PAP SMEAR-Modifier  Never done  . COLONOSCOPY (Pts 45-51yrs Insurance coverage will need to be confirmed)  Never done  . MAMMOGRAM  Never done  . INFLUENZA VACCINE  Never done    No flowsheet data found.

## 2020-10-10 ENCOUNTER — Ambulatory Visit: Payer: BLUE CROSS/BLUE SHIELD | Admitting: Family Medicine

## 2022-03-24 ENCOUNTER — Ambulatory Visit: Payer: BLUE CROSS/BLUE SHIELD | Admitting: Internal Medicine

## 2022-08-16 ENCOUNTER — Ambulatory Visit: Payer: BLUE CROSS/BLUE SHIELD | Admitting: Internal Medicine

## 2022-12-31 DIAGNOSIS — D509 Iron deficiency anemia, unspecified: Secondary | ICD-10-CM | POA: Insufficient documentation

## 2023-01-27 NOTE — Progress Notes (Signed)
[facility] PRIMARY CARE [facility] [address] [place] [address] Dept: [phone] Dept Fax: [phone]  New Patient Office Visit  Subjective:   Kathryn Christian January 24, 1956 01/28/2023  Chief Complaint  Patient presents with   Bronchitis    Loss her voice    Due to language barrier, an interpreter was present during the HPI, ROS, and discussion for the plan of care.  Interpreter: daughter   HPI: Kathryn Christian presents today to establish care at [facility] [employer]. Introduced to Publishing rights manager role and practice setting.  All questions answered.   Last PCP: [facility] Concerns: See below   Kathryn Christian is a 67 year old female with a past medical history significant for hypertension, type 2 diabetes, iron deficiency anemia, vitamin D deficiency, and hyperlipidemia.  She presents today complaining of persistent cough and hoarseness ongoing for several months.  Daughter (interpreting for patient) reports onset of cough towards the end of April 2024.  She states the patient had recently just received her flu shot and they thought it was a side effect of the vaccine initially.  Patient sought out her primary care at that time, and was diagnosed with a viral infection.  Cough continued to worsen, in which she returned to her PCP in which she had chest x-ray performed which showed concerns for bronchitis, and received antibiotic and cough syrup for treatment.  Daughter states the cough did improve slightly with treatment, but not completely.  She returned again to her primary care, which started her on a albuterol inhaler as needed and Tessalon Perles.  Now patient is also using a albuterol nebulizer, none of which these have resolved the cough and hoarseness.  Denies pain or difficulty with swallowing, chest pain, shortness of breath.  Nothing worsens or alleviates cough and hoarseness of voice.  The following portions of the  patient's history were reviewed and updated as appropriate: past medical history, past surgical history, family history, social history, allergies, medications, and problem list.   Patient Active Problem List   Diagnosis Date Noted   Voice hoarseness 01/28/2023   Chronic cough 01/28/2023   IDA (iron deficiency anemia) 12/31/2022   Essential hypertension 05/01/2019   Type 2 diabetes mellitus without complication, without long-term current use of insulin (HCC) 04/03/2018   Vitamin D deficiency 04/03/2018   High cholesterol 04/03/2018   Cholecystitis 03/23/2018   Epigastric abdominal pain 03/23/2018   Left leg pain 03/23/2018   Obesity (BMI 30-39.9) 03/23/2018   SIRS (systemic inflammatory response syndrome) (HCC) 03/23/2018   Past Medical History:  Diagnosis Date   Diabetes mellitus without complication (HCC)    High cholesterol    Hypertension    Past Surgical History:  Procedure Laterality Date   CHOLECYSTECTOMY N/A 03/23/2018   Procedure: LAPAROSCOPIC CHOLECYSTECTOMY WITH INTRAOPERATIVE CHOLANGIOGRAM;  Surgeon: Darnell Level, MD;  Location: [facility];  Service: General;  Laterality: N/A;   History reviewed. No pertinent family history. Outpatient Medications Prior to Visit  Medication Sig Dispense Refill   acetaminophen (TYLENOL) 325 MG tablet Take 2 tablets (650 mg total) by mouth every 6 (six) hours as needed for mild pain (or Fever >/= 101). 20 tablet 0   amoxicillin-clavulanate (AUGMENTIN) 875-125 MG tablet Take 1 tablet by mouth 2 (two) times daily.     atorvastatin (LIPITOR) 10 MG tablet Take 10 mg by mouth daily.     atorvastatin (LIPITOR) 10 MG tablet Take 1 tablet by mouth daily.     azithromycin (ZITHROMAX) 250 MG  tablet Take by mouth daily.     Cyanocobalamin 2500 MCG TABS Take 1 tablet by mouth daily.     empagliflozin (JARDIANCE) 10 MG TABS tablet Take by mouth daily.     glimepiride (AMARYL) 4 MG tablet Take by mouth in the morning and at bedtime.      hydrochlorothiazide (HYDRODIURIL) 25 MG tablet Take 25 mg by mouth daily.     hydrochlorothiazide (HYDRODIURIL) 25 MG tablet Take 1 tablet by mouth daily.     ipratropium-albuterol (DUONEB) 0.5-2.5 (3) MG/3ML SOLN Inhale into the lungs 2 (two) times daily.     Lancets MISC Use to check blood sugars up to 2 times daily. Diagnosis code: E11.9     meloxicam (MOBIC) 15 MG tablet Take by mouth as needed.     metFORMIN (GLUCOPHAGE) 1000 MG tablet Take by mouth 2 (two) times daily.     Rivaroxaban 15 & 20 MG TBPK Take as directed on package: Start with one 15mg  tablet by mouth twice a day with food. On Day 22, switch to one 20mg  tablet once a day with food. 51 each 0   metFORMIN (GLUCOPHAGE-XR) 500 MG 24 hr tablet Take 1 tablet (500 mg total) by mouth daily. 30 tablet 0   No facility-administered medications prior to visit.   No Known Allergies  ROS: A complete ROS was performed with pertinent positives/negatives noted in the HPI. The remainder of the ROS are negative.   Objective:   Today's Vitals   01/28/23 0829  BP: 100/62  Pulse: 64  Temp: 98 F (36.7 C)  TempSrc: Temporal  SpO2: 97%  Weight: 186 lb 6.4 oz (84.6 kg)  Height: 5\' 1"  (1.549 m)    GENERAL: Well-appearing, in NAD. Well nourished.  SKIN: Pink, warm and dry. No rash, lesion, ulceration, or ecchymoses.  HEENT:    HEAD: Normocephalic, non-traumatic.  EYES: Conjunctive pink without exudate. EARS: External ear w/o redness, swelling, masses, or lesions. EAC clear. TM's intact, translucent w/o bulging, appropriate landmarks visualized.  NOSE: Septum midline w/o deformity. Nares patent, mucosa pink and non-inflamed w/o drainage. No sinus tenderness.  THROAT: Uvula midline. Oropharynx clear. Tonsils non-inflamed w/o exudate. Mucus membranes pink and moist.  NECK: Trachea midline. Full ROM w/o pain or tenderness. No lymphadenopathy.  RESPIRATORY: Chest wall symmetrical. Respirations even and non-labored. Breath sounds clear to  auscultation bilaterally.  CARDIAC: S1, S2 present, regular rate and rhythm. Peripheral pulses 2+ bilaterally.  MSK: Muscle tone and strength appropriate for age. EXTREMITIES: Without clubbing, cyanosis, or edema.  NEUROLOGIC: Steady, even gait.  PSYCH/MENTAL STATUS: Alert, oriented x 3. Cooperative, appropriate mood and affect.   Health Maintenance Due  Topic Date Due   HEMOGLOBIN A1C  Never done   FOOT EXAM  Never done   Diabetic kidney evaluation - Urine ACR  Never done   Diabetic kidney evaluation - eGFR measurement  03/25/2019    No results found for any visits on 01/28/23.     Assessment & Plan:  Chronic cough -     Omeprazole; Take 1 capsule (20 mg total) by mouth daily.  Dispense: 90 capsule; Refill: 0  Voice hoarseness Assessment & Plan: Will trial a PPI therapy for 6 weeks Patient will return for follow-up at that time If no improvement will determine further plan of care at that time with either referral to ENT or alternate medication therapy.  Orders: -     Omeprazole; Take 1 capsule (20 mg total) by mouth daily.  Dispense: 90 capsule; Refill:  0  Of note, portions of this note may have been created with voice recognition software Physicist, medical). While this note has been edited for accuracy, occasional wrong-word or `sound-a-like substitutions may have occurred due to the inherent limitations of voice recognition software.  Return in about 6 weeks (around 03/11/2023) for cough/voice hoarseness. AND 3 months for chronic conditions. Salvatore Decent, FNP

## 2023-01-28 ENCOUNTER — Encounter: Payer: Self-pay | Admitting: Internal Medicine

## 2023-01-28 ENCOUNTER — Ambulatory Visit: Payer: Commercial Managed Care - PPO | Admitting: Internal Medicine

## 2023-01-28 VITALS — BP 100/62 | HR 64 | Temp 98.0°F | Ht 61.0 in | Wt 186.4 lb

## 2023-01-28 DIAGNOSIS — R053 Chronic cough: Secondary | ICD-10-CM | POA: Insufficient documentation

## 2023-01-28 DIAGNOSIS — R490 Dysphonia: Secondary | ICD-10-CM

## 2023-01-28 MED ORDER — OMEPRAZOLE 20 MG PO CPDR
20.0000 mg | DELAYED_RELEASE_CAPSULE | Freq: Every day | ORAL | 0 refills | Status: DC
Start: 2023-01-28 — End: 2023-04-01

## 2023-01-28 NOTE — Assessment & Plan Note (Signed)
Will trial a PPI therapy for 6 weeks Patient will return for follow-up at that time If no improvement will determine further plan of care at that time with either referral to ENT or alternate medication therapy.

## 2023-03-11 ENCOUNTER — Ambulatory Visit: Payer: Commercial Managed Care - PPO | Admitting: Internal Medicine

## 2023-03-31 NOTE — Progress Notes (Signed)
[facility] PRIMARY CARE [facility] [address] [place] [address] Dept: [phone] Dept Fax: [phone]    Subjective:   Kathryn Christian 14-Sep-1955 04/01/2023  Chief Complaint  Patient presents with   Follow-up    Right leg pain started last week  Rx refills on all  meds    HPI: Kathryn Christian presents today for re-assessment and management of chronic medical conditions.  CHRONIC COUGH:  Patient presents for follow-up of chronic cough.  At last visit she was placed on a PPI therapy Omeprazole 20mg  for 6 weeks for suspected GERD.  Today, she reports improvement of cough on PPI therapy.   DIABETES MELLITUS: Kathryn Christian presents for the medical management of diabetes.  Current diabetes medication regimen: Metformin 100mg  BID, Glimepiride 4mg  BID, jardiance 10mg  daily  Patient is  adhering to a diabetic diet.  Patient is  checking BS regularly. Patient is  checking their feet regularly.  Patient is not exercising regularly. Denies polydipsia, polyphagia, polyuria, open wounds or ulcers on feet.  No results found for: "HGBA1C"  No foot exam found No results found for: "LABMICR", "MICROALBUR"  HYPERTENSION: Kathryn Christian presents for the medical management of hypertension.  Patient's current hypertension medication regimen is: hydrochlorothiazide 25mg  PO daily  Patient is  currently taking prescribed medications for HTN.  Patient is not regularly keeping a check on BP at home.  Patient is  adhering to low salt diet.  Denies headache, dizziness, CP, SHOB, vision changes.   BP Readings from Last 3 Encounters:  04/01/23 128/66  01/28/23 100/62  03/24/18 (!) 141/75   HYPERLIPIDEMIA: Kathryn Christian presents for the medical management of hyperlipidemia.  Patient's current HLD regimen is: Atorvastatin 10mg  PO daily  Patient is currently taking prescribed medications for HLD.  Exercising regularly: no Denies myalgias.  No results found for:  "CHOL", "HDL", "LDLCALC", "LDLDIRECT", "TRIG", "CHOLHDL"  VITAMIN D DEFICIENCY: Kathryn Christian presents for the medical management of Vitamin D deficiency.  Current regimen: none , patient's daughter states that she will not take OTC vitamin supplement Up to date DEXA: no Last vitamin D  No results found for: "25OHVITD2", "25OHVITD3", "VD25OH"  HIP PAIN:  Patient complains of right hip and leg pain ongoing for a couple weeks.  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  No recent falls.  Patient has taken some Tylenol with some relief  The following portions of the patient's history were reviewed and updated as appropriate: past medical history, past surgical history, family history, social history, allergies, medications, and problem list.   Patient Active Problem List   Diagnosis Date Noted   Voice hoarseness 01/28/2023   Chronic cough 01/28/2023   IDA (iron deficiency anemia) 12/31/2022   Essential hypertension 05/01/2019   Type 2 diabetes mellitus without complication, without long-term current use of insulin (HCC) 04/03/2018   Vitamin D deficiency 04/03/2018   High cholesterol 04/03/2018   Cholecystitis 03/23/2018   Epigastric abdominal pain 03/23/2018   Left leg pain 03/23/2018   Obesity (BMI 30-39.9) 03/23/2018   SIRS (systemic inflammatory response syndrome) (HCC) 03/23/2018   Past Medical History:  Diagnosis Date   Diabetes mellitus without complication (HCC)    High cholesterol    Hypertension    Past Surgical History:  Procedure Laterality Date   CHOLECYSTECTOMY N/A 03/23/2018   Procedure: LAPAROSCOPIC CHOLECYSTECTOMY WITH INTRAOPERATIVE CHOLANGIOGRAM;  Surgeon: Darnell Level, MD;  Location: [facility];  Service: General;  Laterality: N/A;   History reviewed.  No pertinent family history.  Current Outpatient Medications:    acetaminophen (TYLENOL) 325 MG tablet, Take 2 tablets (650 mg total) by mouth every 6 (six) hours as needed for  mild pain (or Fever >/= 101)., Disp: 20 tablet, Rfl: 0   Lancets MISC, Use to check blood sugars up to 2 times daily. Diagnosis code: E11.9, Disp: , Rfl:    Rivaroxaban 15 & 20 MG TBPK, Take as directed on package: Start with one 15mg  tablet by mouth twice a day with food. On Day 22, switch to one 20mg  tablet once a day with food., Disp: 51 each, Rfl: 0   atorvastatin (LIPITOR) 10 MG tablet, Take 1 tablet (10 mg total) by mouth daily., Disp: 90 tablet, Rfl: 1   empagliflozin (JARDIANCE) 10 MG TABS tablet, Take by mouth daily. (Patient not taking: Reported on 04/01/2023), Disp: , Rfl:    glimepiride (AMARYL) 4 MG tablet, Take by mouth in the morning and at bedtime., Disp: , Rfl:    hydrochlorothiazide (HYDRODIURIL) 25 MG tablet, Take 1 tablet (25 mg total) by mouth daily. Take 25 mg by mouth daily., Disp: 90 tablet, Rfl: 1   ipratropium-albuterol (DUONEB) 0.5-2.5 (3) MG/3ML SOLN, Inhale into the lungs 2 (two) times daily., Disp: , Rfl:    meloxicam (MOBIC) 15 MG tablet, Take by mouth as needed. (Patient not taking: Reported on 04/01/2023), Disp: , Rfl:    metFORMIN (GLUCOPHAGE) 1000 MG tablet, Take 1 tablet (1,000 mg total) by mouth 2 (two) times daily., Disp: 180 tablet, Rfl: 1   omeprazole (PRILOSEC) 20 MG capsule, Take 1 capsule (20 mg total) by mouth daily., Disp: 90 capsule, Rfl: 1 No Known Allergies   ROS: A complete ROS was performed with pertinent positives/negatives noted in the HPI. The remainder of the ROS are negative.    Objective:   Today's Vitals   04/01/23 0810  BP: 128/66  Pulse: 63  Temp: 98 F (36.7 C)  TempSrc: Temporal  SpO2: 99%  Weight: 195 lb (88.5 kg)  Height: 5\' 1"  (1.549 m)    GENERAL: Well-appearing, in NAD. Well nourished.  SKIN: Pink, warm and dry. No rash, lesion, ulceration, or ecchymoses.  NECK: Trachea midline. Full ROM w/o pain or tenderness. No lymphadenopathy.  RESPIRATORY: Chest wall symmetrical. Respirations even and non-labored. Breath sounds  clear to auscultation bilaterally.  CARDIAC: S1, S2 present, regular rate and rhythm. Peripheral pulses 2+ bilaterally.  MSK: Muscle tone and strength appropriate for age.  Pain to right buttock near sacroiliac region EXTREMITIES: Without clubbing, cyanosis, or edema.  NEUROLOGIC: No motor or sensory deficits. Steady, even gait. Sensory exam of the foot is normal, tested with the monofilament. Good pulses, no lesions or ulcers, good peripheral pulses. PSYCH/MENTAL STATUS: Alert, oriented x 3. Cooperative, appropriate mood and affect.   Health Maintenance Due  Topic Date Due   HEMOGLOBIN A1C  Never done   Diabetic kidney evaluation - Urine ACR  Never done   Diabetic kidney evaluation - eGFR measurement  03/25/2019   INFLUENZA VACCINE  03/10/2023    No results found for any visits on 04/01/23.  The ASCVD Risk score (Arnett DK, et al., 2019) failed to calculate for the following reasons:   The valid total cholesterol range is 130 to 320 mg/dL     Assessment & Plan:  1. Gastroesophageal reflux disease without esophagitis - omeprazole (PRILOSEC) 20 MG capsule; Take 1 capsule (20 mg total) by mouth daily.  Dispense: 90 capsule; Refill: 1 - well controlled  2. Type 2 diabetes mellitus without complication, without long-term current use of insulin (HCC) - CBC with Differential/Platelet - Comprehensive metabolic panel - Hemoglobin A1c - Microalbumin / creatinine urine ratio - metFORMIN (GLUCOPHAGE) 1000 MG tablet; Take 1 tablet (1,000 mg total) by mouth 2 (two) times daily.  Dispense: 180 tablet; Refill: 1 - will make adjustments to medication based upon lab results  3. Essential hypertension - Comprehensive metabolic panel - TSH - hydrochlorothiazide (HYDRODIURIL) 25 MG tablet; Take 1 tablet (25 mg total) by mouth daily. Take 25 mg by mouth daily.  Dispense: 90 tablet; Refill: 1 - well controlled  4. High cholesterol - Lipid panel - atorvastatin (LIPITOR) 10 MG tablet; Take 1  tablet (10 mg total) by mouth daily.  Dispense: 90 tablet; Refill: 1  5. Vitamin D deficiency - VITAMIN D 25 Hydroxy (Vit-D Deficiency, Fractures) - will send in Rx for vitamin D based upon lab results   6. Estradiol deficiency - DG Bone Density; Future  7. Sacroiliac pain Take Meloxicam 15mg  by mouth once daily for 1-2 weeks for hip/leg pain. Rest.  Alternate Ice and heat pad   Patient declines mammogram. Declines PNA 20 vaccine at this time, Info provided on d/c summary.   Orders Placed This Encounter  Procedures   DG Bone Density    Standing Status:   Future    Standing Expiration Date:   03/31/2024    Order Specific Question:   Reason for Exam (SYMPTOM  OR DIAGNOSIS REQUIRED)    Answer:   postmenopausal estrogen deficiency    Order Specific Question:   Preferred imaging location?    Answer:   [facility]   CBC with Differential/Platelet   Comprehensive metabolic panel   Hemoglobin A1c   Lipid panel   TSH   Microalbumin / creatinine urine ratio   VITAMIN D 25 Hydroxy (Vit-D Deficiency, Fractures)   No images are attached to the encounter or orders placed in the encounter. Meds ordered this encounter  Medications   atorvastatin (LIPITOR) 10 MG tablet    Sig: Take 1 tablet (10 mg total) by mouth daily.    Dispense:  90 tablet    Refill:  1   hydrochlorothiazide (HYDRODIURIL) 25 MG tablet    Sig: Take 1 tablet (25 mg total) by mouth daily. Take 25 mg by mouth daily.    Dispense:  90 tablet    Refill:  1   omeprazole (PRILOSEC) 20 MG capsule    Sig: Take 1 capsule (20 mg total) by mouth daily.    Dispense:  90 capsule    Refill:  1    Order Specific Question:   Supervising Provider    Answer:   Garnette Gunner [[id number]]   metFORMIN (GLUCOPHAGE) 1000 MG tablet    Sig: Take 1 tablet (1,000 mg total) by mouth 2 (two) times daily.    Dispense:  180 tablet    Refill:  1    Order Specific Question:   Supervising Provider    Answer:   Garnette Gunner [[id number]]     Return in about 3 months (around 07/02/2023) for Chronic Condition follow up.   Salvatore Decent, FNP

## 2023-04-01 ENCOUNTER — Ambulatory Visit: Payer: Commercial Managed Care - PPO | Admitting: Internal Medicine

## 2023-04-01 ENCOUNTER — Encounter: Payer: Self-pay | Admitting: Internal Medicine

## 2023-04-01 VITALS — BP 128/66 | HR 63 | Temp 98.0°F | Ht 61.0 in | Wt 195.0 lb

## 2023-04-01 DIAGNOSIS — E1165 Type 2 diabetes mellitus with hyperglycemia: Secondary | ICD-10-CM

## 2023-04-01 DIAGNOSIS — M533 Sacrococcygeal disorders, not elsewhere classified: Secondary | ICD-10-CM

## 2023-04-01 DIAGNOSIS — I10 Essential (primary) hypertension: Secondary | ICD-10-CM

## 2023-04-01 DIAGNOSIS — Z7984 Long term (current) use of oral hypoglycemic drugs: Secondary | ICD-10-CM

## 2023-04-01 DIAGNOSIS — E559 Vitamin D deficiency, unspecified: Secondary | ICD-10-CM | POA: Diagnosis not present

## 2023-04-01 DIAGNOSIS — E7800 Pure hypercholesterolemia, unspecified: Secondary | ICD-10-CM | POA: Diagnosis not present

## 2023-04-01 DIAGNOSIS — E119 Type 2 diabetes mellitus without complications: Secondary | ICD-10-CM

## 2023-04-01 DIAGNOSIS — E348 Other specified endocrine disorders: Secondary | ICD-10-CM

## 2023-04-01 DIAGNOSIS — R053 Chronic cough: Secondary | ICD-10-CM

## 2023-04-01 DIAGNOSIS — K219 Gastro-esophageal reflux disease without esophagitis: Secondary | ICD-10-CM

## 2023-04-01 LAB — COMPREHENSIVE METABOLIC PANEL
ALT: 18 U/L (ref 0–35)
AST: 18 U/L (ref 0–37)
Albumin: 4.2 g/dL (ref 3.5–5.2)
Alkaline Phosphatase: 64 U/L (ref 39–117)
BUN: 18 mg/dL (ref 6–23)
CO2: 33 mEq/L — ABNORMAL HIGH (ref 19–32)
Calcium: 9.5 mg/dL (ref 8.4–10.5)
Chloride: 101 mEq/L (ref 96–112)
Creatinine, Ser: 0.92 mg/dL (ref 0.40–1.20)
GFR: 64.6 mL/min (ref >60.00)
Glucose, Bld: 103 mg/dL — ABNORMAL HIGH (ref 70–99)
Potassium: 4.2 mEq/L (ref 3.5–5.1)
Sodium: 142 mEq/L (ref 135–145)
Total Bilirubin: 0.5 mg/dL (ref 0.2–1.2)
Total Protein: 7.1 g/dL (ref 6.0–8.3)

## 2023-04-01 LAB — TSH: TSH: 68.51 u[IU]/mL — ABNORMAL HIGH (ref 0.35–5.50)

## 2023-04-01 LAB — LDL CHOLESTEROL, DIRECT: Direct LDL: 99 mg/dL

## 2023-04-01 LAB — CBC WITH DIFFERENTIAL/PLATELET
Basophils Absolute: 0.1 10*3/uL (ref 0.0–0.1)
Basophils Relative: 1.1 % (ref 0.0–3.0)
Eosinophils Absolute: 0.1 10*3/uL (ref 0.0–0.7)
Eosinophils Relative: 1.4 % (ref 0.0–5.0)
HCT: 39.2 % (ref 36.0–46.0)
Hemoglobin: 12.2 g/dL (ref 12.0–15.0)
Lymphocytes Relative: 32 % (ref 12.0–46.0)
Lymphs Abs: 2.4 10*3/uL (ref 0.7–4.0)
MCHC: 31.1 g/dL (ref 30.0–36.0)
MCV: 79.3 fl (ref 78.0–100.0)
Monocytes Absolute: 0.3 10*3/uL (ref 0.1–1.0)
Monocytes Relative: 4.7 % (ref 3.0–12.0)
Neutro Abs: 4.5 10*3/uL (ref 1.4–7.7)
Neutrophils Relative %: 60.8 % (ref 43.0–77.0)
Platelets: 277 10*3/uL (ref 150.0–400.0)
RBC: 4.95 Mil/uL (ref 3.87–5.11)
RDW: 18 % — ABNORMAL HIGH (ref 11.5–15.5)
WBC: 7.5 10*3/uL (ref 4.0–10.5)

## 2023-04-01 LAB — LIPID PANEL
Cholesterol: 181 mg/dL (ref 0–200)
HDL: 54.5 mg/dL (ref >39.00)
NonHDL: 126.11
Total CHOL/HDL Ratio: 3
Triglycerides: 224 mg/dL — ABNORMAL HIGH (ref 0.0–149.0)
VLDL: 44.8 mg/dL — ABNORMAL HIGH (ref 0.0–40.0)

## 2023-04-01 LAB — MICROALBUMIN / CREATININE URINE RATIO
Creatinine,U: 39.8 mg/dL
Microalb Creat Ratio: 1.8 mg/g (ref 0.0–30.0)
Microalb, Ur: <0.7 mg/dL (ref 0.0–1.9)

## 2023-04-01 LAB — HEMOGLOBIN A1C: Hgb A1c MFr Bld: 7.5 % — ABNORMAL HIGH (ref 4.6–6.5)

## 2023-04-01 LAB — VITAMIN D 25 HYDROXY (VIT D DEFICIENCY, FRACTURES): VITD: 23.8 ng/mL — ABNORMAL LOW (ref 30.00–100.00)

## 2023-04-01 MED ORDER — ATORVASTATIN CALCIUM 10 MG PO TABS
10.0000 mg | ORAL_TABLET | Freq: Every day | ORAL | 1 refills | Status: DC
Start: 2023-04-01 — End: 2023-06-24

## 2023-04-01 MED ORDER — HYDROCHLOROTHIAZIDE 25 MG PO TABS
25.0000 mg | ORAL_TABLET | Freq: Every day | ORAL | 1 refills | Status: DC
Start: 2023-04-01 — End: 2023-09-19

## 2023-04-01 MED ORDER — METFORMIN HCL 1000 MG PO TABS
1000.0000 mg | ORAL_TABLET | Freq: Two times a day (BID) | ORAL | 1 refills | Status: DC
Start: 2023-04-01 — End: 2023-06-24

## 2023-04-01 MED ORDER — OMEPRAZOLE 20 MG PO CPDR: 20.0000 mg | DELAYED_RELEASE_CAPSULE | Freq: Every day | ORAL | 1 refills | Status: DC

## 2023-04-01 NOTE — Patient Instructions (Signed)
Take Meloxicam 15mg  by mouth once daily for 1-2 weeks for hip/leg pain. Rest.  Alternate Ice and heat pad

## 2023-04-04 ENCOUNTER — Other Ambulatory Visit: Payer: Self-pay | Admitting: Internal Medicine

## 2023-04-04 DIAGNOSIS — E559 Vitamin D deficiency, unspecified: Secondary | ICD-10-CM

## 2023-04-04 DIAGNOSIS — D509 Iron deficiency anemia, unspecified: Secondary | ICD-10-CM

## 2023-04-04 DIAGNOSIS — R7989 Other specified abnormal findings of blood chemistry: Secondary | ICD-10-CM

## 2023-04-04 MED ORDER — VITAMIN D 25 MCG (1000 UNIT) PO TABS
1000.0000 [IU] | ORAL_TABLET | Freq: Every day | ORAL | 1 refills | Status: DC
Start: 2023-04-04 — End: 2023-09-27

## 2023-04-04 MED ORDER — MELOXICAM 15 MG PO TABS: 15.0000 mg | ORAL_TABLET | ORAL | 0 refills | Status: DC | PRN

## 2023-04-04 NOTE — Telephone Encounter (Signed)
Is it ok to refill? 

## 2023-04-04 NOTE — Telephone Encounter (Signed)
Pt needs meloxicam (MOBIC) 15 MG tablet [[id number]] refilled at [id number] - [place] [place] - [address], [place] [address] Phone: [phone]  Fax: [phone]

## 2023-04-04 NOTE — Telephone Encounter (Signed)
Yes okay to refill  

## 2023-04-08 ENCOUNTER — Other Ambulatory Visit (INDEPENDENT_AMBULATORY_CARE_PROVIDER_SITE_OTHER): Payer: Commercial Managed Care - PPO

## 2023-04-08 DIAGNOSIS — D509 Iron deficiency anemia, unspecified: Secondary | ICD-10-CM

## 2023-04-08 DIAGNOSIS — R7989 Other specified abnormal findings of blood chemistry: Secondary | ICD-10-CM

## 2023-04-08 LAB — T4, FREE: Free T4: 0.55 ng/dL — ABNORMAL LOW (ref 0.60–1.60)

## 2023-04-08 LAB — TSH: TSH: 34.99 u[IU]/mL — ABNORMAL HIGH (ref 0.35–5.50)

## 2023-04-08 NOTE — Addendum Note (Signed)
Addended by: Lindley Magnus L on: 04/08/2023 08:36 AM   Modules accepted: Orders

## 2023-04-09 LAB — IRON,TIBC AND FERRITIN PANEL
%SAT: 16 % (ref 16–45)
Ferritin: 23 ng/mL (ref 16–288)
Iron: 69 ug/dL (ref 45–160)
TIBC: 421 ug/dL (ref 250–450)

## 2023-04-18 ENCOUNTER — Other Ambulatory Visit: Payer: Self-pay | Admitting: Internal Medicine

## 2023-04-18 DIAGNOSIS — R7989 Other specified abnormal findings of blood chemistry: Secondary | ICD-10-CM

## 2023-04-27 NOTE — Progress Notes (Unsigned)
[facility]ARE [facility] [address] [place] [address] Dept: [phone] Dept Fax: [phone]  Acute Care Office Visit  Subjective:   Kathryn Christian 1956-04-29 04/28/2023  No chief complaint on file.   HPI: ***  The following portions of the patient's history were reviewed and updated as appropriate: past medical history, past surgical history, family history, social history, allergies, medications, and problem list.   Patient Active Problem List   Diagnosis Date Noted   Voice hoarseness 01/28/2023   Chronic cough 01/28/2023   IDA (iron deficiency anemia) 12/31/2022   Essential hypertension 05/01/2019   Type 2 diabetes mellitus without complication, without long-term current use of insulin (HCC) 04/03/2018   Vitamin D deficiency 04/03/2018   High cholesterol 04/03/2018   Cholecystitis 03/23/2018   Epigastric abdominal pain 03/23/2018   Left leg pain 03/23/2018   Obesity (BMI 30-39.9) 03/23/2018   SIRS (systemic inflammatory response syndrome) (HCC) 03/23/2018   Past Medical History:  Diagnosis Date   Diabetes mellitus without complication (HCC)    High cholesterol    Hypertension    Past Surgical History:  Procedure Laterality Date   CHOLECYSTECTOMY N/A 03/23/2018   Procedure: LAPAROSCOPIC CHOLECYSTECTOMY WITH INTRAOPERATIVE CHOLANGIOGRAM;  Surgeon: Darnell Level, MD;  Location: [facility];  Service: General;  Laterality: N/A;   No family history on file.  Current Outpatient Medications:    acetaminophen (TYLENOL) 325 MG tablet, Take 2 tablets (650 mg total) by mouth every 6 (six) hours as needed for mild pain (or Fever >/= 101)., Disp: 20 tablet, Rfl: 0   atorvastatin (LIPITOR) 10 MG tablet, Take 1 tablet (10 mg total) by mouth daily., Disp: 90 tablet, Rfl: 1   cholecalciferol (VITAMIN D3) 25 MCG (1000 UNIT) tablet, Take 1 tablet (1,000 Units total) by mouth daily., Disp: 90 tablet, Rfl: 1   empagliflozin (JARDIANCE) 10 MG  TABS tablet, Take by mouth daily. (Patient not taking: Reported on 04/01/2023), Disp: , Rfl:    glimepiride (AMARYL) 4 MG tablet, Take by mouth in the morning and at bedtime., Disp: , Rfl:    hydrochlorothiazide (HYDRODIURIL) 25 MG tablet, Take 1 tablet (25 mg total) by mouth daily. Take 25 mg by mouth daily., Disp: 90 tablet, Rfl: 1   ipratropium-albuterol (DUONEB) 0.5-2.5 (3) MG/3ML SOLN, Inhale into the lungs 2 (two) times daily., Disp: , Rfl:    Lancets MISC, Use to check blood sugars up to 2 times daily. Diagnosis code: E11.9, Disp: , Rfl:    meloxicam (MOBIC) 15 MG tablet, Take 1 tablet (15 mg total) by mouth as needed. Take by mouth as needed., Disp: 30 tablet, Rfl: 0   metFORMIN (GLUCOPHAGE) 1000 MG tablet, Take 1 tablet (1,000 mg total) by mouth 2 (two) times daily., Disp: 180 tablet, Rfl: 1   omeprazole (PRILOSEC) 20 MG capsule, Take 1 capsule (20 mg total) by mouth daily., Disp: 90 capsule, Rfl: 1   Rivaroxaban 15 & 20 MG TBPK, Take as directed on package: Start with one 15mg  tablet by mouth twice a day with food. On Day 22, switch to one 20mg  tablet once a day with food., Disp: 51 each, Rfl: 0 No Known Allergies   ROS: A complete ROS was performed with pertinent positives/negatives noted in the HPI. The remainder of the ROS are negative.    Objective:   There were no vitals filed for this visit.  GENERAL: Well-appearing, in NAD. Well nourished.  SKIN: Pink, warm and dry. No rash, lesion, ulceration, or ecchymoses.  NECK:  Trachea midline. Full ROM w/o pain or tenderness. No lymphadenopathy.  RESPIRATORY: Chest wall symmetrical. Respirations even and non-labored. Breath sounds clear to auscultation bilaterally.  CARDIAC: S1, S2 present, regular rate and rhythm. Peripheral pulses 2+ bilaterally.  MSK: Muscle tone and strength appropriate for age. Joints w/o tenderness, redness, or swelling. EXTREMITIES: Without clubbing, cyanosis, or edema.  NEUROLOGIC: No motor or sensory deficits.  Steady, even gait.  PSYCH/MENTAL STATUS: Alert, oriented x 3. Cooperative, appropriate mood and affect.    No results found for any visits on 04/28/23.    Assessment & Plan:   There are no diagnoses linked to this encounter. No orders of the defined types were placed in this encounter.  No orders of the defined types were placed in this encounter.  Lab Orders  No laboratory test(s) ordered today   No images are attached to the encounter or orders placed in the encounter.  No follow-ups on file.   Salvatore Decent, FNP

## 2023-04-28 ENCOUNTER — Ambulatory Visit: Payer: Commercial Managed Care - PPO | Admitting: Internal Medicine

## 2023-04-28 ENCOUNTER — Encounter: Payer: Self-pay | Admitting: Internal Medicine

## 2023-04-28 VITALS — BP 122/70 | HR 96 | Temp 98.4°F | Ht 61.0 in | Wt 190.4 lb

## 2023-04-28 DIAGNOSIS — J40 Bronchitis, not specified as acute or chronic: Secondary | ICD-10-CM

## 2023-04-28 LAB — POC COVID19 BINAXNOW: SARS Coronavirus 2 Ag: NEGATIVE

## 2023-04-28 LAB — POCT INFLUENZA A/B
Influenza A, POC: NEGATIVE
Influenza B, POC: NEGATIVE

## 2023-04-28 MED ORDER — ALBUTEROL SULFATE HFA 108 (90 BASE) MCG/ACT IN AERS: 2.0000 | INHALATION_SPRAY | Freq: Four times a day (QID) | RESPIRATORY_TRACT | 2 refills | Status: DC | PRN

## 2023-04-28 MED ORDER — BENZONATATE 100 MG PO CAPS: 100.0000 mg | ORAL_CAPSULE | Freq: Three times a day (TID) | ORAL | 0 refills | Status: DC | PRN

## 2023-04-28 MED ORDER — PREDNISONE 10 MG (21) PO TBPK
ORAL_TABLET | ORAL | 0 refills | Status: DC
Start: 2023-04-28 — End: 2023-06-24

## 2023-04-28 MED ORDER — AZITHROMYCIN 250 MG PO TABS
ORAL_TABLET | ORAL | 0 refills | Status: AC
Start: 2023-04-28 — End: 2023-05-03

## 2023-05-06 ENCOUNTER — Ambulatory Visit: Payer: Commercial Managed Care - PPO | Admitting: Internal Medicine

## 2023-05-09 ENCOUNTER — Other Ambulatory Visit: Payer: Self-pay | Admitting: Internal Medicine

## 2023-05-09 DIAGNOSIS — J40 Bronchitis, not specified as acute or chronic: Secondary | ICD-10-CM

## 2023-05-11 ENCOUNTER — Telehealth: Payer: Self-pay | Admitting: Internal Medicine

## 2023-05-11 NOTE — Telephone Encounter (Signed)
Prescription Request  05/11/2023  LOV: 04/28/2023  What is the name of the medication or equipment? benzonatate (TESSALON) 100 MG capsule [[id number]]    Have you contacted your pharmacy to request a refill? Yes   Which pharmacy would you like this sent to?    [id number] - [place], [place] - [address] [place] [address] Phone: [phone] Fax: [phone]    Patient notified that their request is being sent to the clinical staff for review and that they should receive a response within 2 business days.   Please advise at [facility] [phone]

## 2023-05-12 ENCOUNTER — Other Ambulatory Visit: Payer: Self-pay | Admitting: Family

## 2023-05-12 DIAGNOSIS — J40 Bronchitis, not specified as acute or chronic: Secondary | ICD-10-CM

## 2023-05-12 MED ORDER — BENZONATATE 100 MG PO CAPS
100.0000 mg | ORAL_CAPSULE | Freq: Three times a day (TID) | ORAL | 0 refills | Status: DC | PRN
Start: 2023-05-12 — End: 2023-06-24

## 2023-05-12 NOTE — Telephone Encounter (Signed)
Lft detailed VM that RX was sent to the pharmacy. Dm/cma  

## 2023-06-24 ENCOUNTER — Ambulatory Visit: Payer: Commercial Managed Care - PPO | Admitting: Internal Medicine

## 2023-06-24 ENCOUNTER — Encounter: Payer: Self-pay | Admitting: Internal Medicine

## 2023-06-24 VITALS — BP 130/72 | HR 71 | Temp 98.1°F | Ht 61.0 in | Wt 195.2 lb

## 2023-06-24 DIAGNOSIS — R7989 Other specified abnormal findings of blood chemistry: Secondary | ICD-10-CM | POA: Diagnosis not present

## 2023-06-24 DIAGNOSIS — Z23 Encounter for immunization: Secondary | ICD-10-CM

## 2023-06-24 DIAGNOSIS — E7800 Pure hypercholesterolemia, unspecified: Secondary | ICD-10-CM | POA: Diagnosis not present

## 2023-06-24 DIAGNOSIS — E1165 Type 2 diabetes mellitus with hyperglycemia: Secondary | ICD-10-CM

## 2023-06-24 DIAGNOSIS — Z7984 Long term (current) use of oral hypoglycemic drugs: Secondary | ICD-10-CM

## 2023-06-24 DIAGNOSIS — J40 Bronchitis, not specified as acute or chronic: Secondary | ICD-10-CM

## 2023-06-24 DIAGNOSIS — K219 Gastro-esophageal reflux disease without esophagitis: Secondary | ICD-10-CM | POA: Diagnosis not present

## 2023-06-24 LAB — BASIC METABOLIC PANEL
BUN: 25 mg/dL — ABNORMAL HIGH (ref 6–23)
CO2: 32 meq/L (ref 19–32)
Calcium: 9.4 mg/dL (ref 8.4–10.5)
Chloride: 101 meq/L (ref 96–112)
Creatinine, Ser: 0.84 mg/dL (ref 0.40–1.20)
GFR: 71.93 mL/min (ref >60.00)
Glucose, Bld: 104 mg/dL — ABNORMAL HIGH (ref 70–99)
Potassium: 4.1 meq/L (ref 3.5–5.1)
Sodium: 141 meq/L (ref 135–145)

## 2023-06-24 LAB — TSH: TSH: 5.16 u[IU]/mL (ref 0.35–5.50)

## 2023-06-24 LAB — POCT GLYCOSYLATED HEMOGLOBIN (HGB A1C): Hemoglobin A1C: 7.3 % — AB (ref 4.0–5.6)

## 2023-06-24 LAB — T4, FREE: Free T4: 0.86 ng/dL (ref 0.60–1.60)

## 2023-06-24 MED ORDER — METFORMIN HCL 1000 MG PO TABS
1000.0000 mg | ORAL_TABLET | Freq: Two times a day (BID) | ORAL | 1 refills | Status: DC
Start: 2023-06-24 — End: 2024-04-04

## 2023-06-24 MED ORDER — BENZONATATE 100 MG PO CAPS: 100.0000 mg | ORAL_CAPSULE | Freq: Three times a day (TID) | ORAL | 0 refills | Status: DC | PRN

## 2023-06-24 MED ORDER — ALBUTEROL SULFATE HFA 108 (90 BASE) MCG/ACT IN AERS: 2.0000 | INHALATION_SPRAY | Freq: Four times a day (QID) | RESPIRATORY_TRACT | 2 refills | Status: AC | PRN

## 2023-06-24 MED ORDER — ATORVASTATIN CALCIUM 10 MG PO TABS
10.0000 mg | ORAL_TABLET | Freq: Every day | ORAL | 1 refills | Status: DC
Start: 2023-06-24 — End: 2024-01-09

## 2023-06-24 MED ORDER — EMPAGLIFLOZIN 10 MG PO TABS
10.0000 mg | ORAL_TABLET | Freq: Every day | ORAL | 1 refills | Status: DC
Start: 2023-06-24 — End: 2023-12-12

## 2023-06-24 MED ORDER — OMEPRAZOLE 20 MG PO CPDR: 20.0000 mg | DELAYED_RELEASE_CAPSULE | Freq: Every day | ORAL | 1 refills | Status: DC

## 2023-06-24 NOTE — Progress Notes (Signed)
[facility] [address] [place] [address] Dept: [phone] Dept Fax: [phone]    Subjective:   Kathryn Christian Sep 19, 1955 06/24/2023  Chief Complaint  Patient presents with   Follow-up   Medication Refill    2 month supply     HPI: Kathryn Christian presents today for re-assessment and management of chronic medical conditions. Discussed the use of AI scribe software for clinical note transcription with the patient, who gave verbal consent to proceed.  History of Present Illness   The patient, with a history of diabetes, HLD, GERD, and hypertension, presents for a follow-up visit. The patient is due for a flu shot and is planning to travel out of the country next week. The patient's family member reports that the patient's blood sugars have been running low, with readings between 50 and 90. The patient has been feeling weak and jittery, which may be due to the hypoglycemia. The patient's current diabetes medications include Jardiance 10mg , metformin 1000mg  BID, and glimepiride 4mg  BID.  The patient also has intermittent cough. The patient has been taking cough medication and an albuterol inhaler for symptom management. The patient also takes omeprazole for acid reflux, which contributes to the cough.  Recently, the patient's thyroid levels were found to be high, indicating hypothyroidism. This is a new finding for the patient, who has no history of thyroid disease. [place] of thyroid was ordered, but has not been done. The patient has noticed a lump in her throat.        The following portions of the patient's history were reviewed and updated as appropriate: past medical history, past surgical history, family history, social history, allergies, medications, and problem list.   Patient Active Problem List   Diagnosis Date Noted   Voice hoarseness 01/28/2023   Chronic cough 01/28/2023   IDA (iron deficiency anemia) 12/31/2022    Essential hypertension 05/01/2019   Type 2 diabetes mellitus without complication, without long-term current use of insulin (HCC) 04/03/2018   Vitamin D deficiency 04/03/2018   High cholesterol 04/03/2018   Cholecystitis 03/23/2018   Epigastric abdominal pain 03/23/2018   Left leg pain 03/23/2018   Obesity (BMI 30-39.9) 03/23/2018   SIRS (systemic inflammatory response syndrome) (HCC) 03/23/2018   Past Medical History:  Diagnosis Date   Diabetes mellitus without complication (HCC)    High cholesterol    Hypertension    Past Surgical History:  Procedure Laterality Date   CHOLECYSTECTOMY N/A 03/23/2018   Procedure: LAPAROSCOPIC CHOLECYSTECTOMY WITH INTRAOPERATIVE CHOLANGIOGRAM;  Surgeon: Darnell Level, MD;  Location: [facility];  Service: General;  Laterality: N/A;   History reviewed. No pertinent family history.  Current Outpatient Medications:    acetaminophen (TYLENOL) 325 MG tablet, Take 2 tablets (650 mg total) by mouth every 6 (six) hours as needed for mild pain (or Fever >/= 101)., Disp: 20 tablet, Rfl: 0   cholecalciferol (VITAMIN D3) 25 MCG (1000 UNIT) tablet, Take 1 tablet (1,000 Units total) by mouth daily., Disp: 90 tablet, Rfl: 1   hydrochlorothiazide (HYDRODIURIL) 25 MG tablet, Take 1 tablet (25 mg total) by mouth daily. Take 25 mg by mouth daily., Disp: 90 tablet, Rfl: 1   Lancets MISC, Use to check blood sugars up to 2 times daily. Diagnosis code: E11.9, Disp: , Rfl:    meloxicam (MOBIC) 15 MG tablet, Take 1 tablet (15 mg total) by mouth as needed. Take by mouth as needed., Disp: 30 tablet, Rfl: 0   albuterol (VENTOLIN HFA) 108 (90  Base) MCG/ACT inhaler, Inhale 2 puffs into the lungs every 6 (six) hours as needed for wheezing or shortness of breath., Disp: 18 g, Rfl: 2   atorvastatin (LIPITOR) 10 MG tablet, Take 1 tablet (10 mg total) by mouth daily., Disp: 90 tablet, Rfl: 1   benzonatate (TESSALON) 100 MG capsule, Take 1 capsule (100 mg total) by mouth 3 (three) times daily as  needed for cough., Disp: 60 capsule, Rfl: 0   empagliflozin (JARDIANCE) 10 MG TABS tablet, Take 1 tablet (10 mg total) by mouth daily., Disp: 90 tablet, Rfl: 1   ipratropium-albuterol (DUONEB) 0.5-2.5 (3) MG/3ML SOLN, Inhale into the lungs 2 (two) times daily., Disp: , Rfl:    metFORMIN (GLUCOPHAGE) 1000 MG tablet, Take 1 tablet (1,000 mg total) by mouth 2 (two) times daily., Disp: 180 tablet, Rfl: 1   omeprazole (PRILOSEC) 20 MG capsule, Take 1 capsule (20 mg total) by mouth daily., Disp: 90 capsule, Rfl: 1 No Known Allergies   ROS: A complete ROS was performed with pertinent positives/negatives noted in the HPI. The remainder of the ROS are negative.    Objective:   Today's Vitals   06/24/23 0806  BP: 130/72  Pulse: 71  Temp: 98.1 F (36.7 C)  TempSrc: Temporal  SpO2: 97%  Weight: 195 lb 3.2 oz (88.5 kg)  Height: 5\' 1"  (1.549 m)    GENERAL: Well-appearing, in NAD. Well nourished.  SKIN: Pink, warm and dry. No rash, lesion, ulceration, or ecchymoses.  NECK: Trachea midline. Full ROM w/o pain or tenderness. No lymphadenopathy. No palpable thyroid nodules or enlargement upon palpation. RESPIRATORY: Chest wall symmetrical. Respirations even and non-labored. Breath sounds clear to auscultation bilaterally.  CARDIAC: S1, S2 present, regular rate and rhythm. Peripheral pulses 2+ bilaterally.  EXTREMITIES: Without clubbing, cyanosis, or edema.  NEUROLOGIC: Steady, even gait.  PSYCH/MENTAL STATUS: Alert, oriented x 3. Cooperative, appropriate mood and affect.   There are no preventive care reminders to display for this patient.  Results for orders placed or performed in visit on 06/24/23  POCT glycosylated hemoglobin (Hb A1C)  Result Value Ref Range   Hemoglobin A1C 7.3 (A) 4.0 - 5.6 %   HbA1c POC (<> result, manual entry)     HbA1c, POC (prediabetic range)     HbA1c, POC (controlled diabetic range)      The 10-year ASCVD risk score (Arnett DK, et al., 2019) is: 16.9%      Assessment & Plan:  Assessment and Plan    Diabetes Mellitus Recent hypoglycemia with blood sugars ranging from 50-90. A1C checked today was 7.3%. Patient has been feeling weak, possibly due to low blood sugars. -Discontinue Glimepiride due to hypoglycemia. -Continue Metformin 1000mg  twice daily and Jardiance 10mg  daily.  Hypertension Blood pressure well controlled at 130/72 on Hydrochlorothiazide 25mg  daily. -Continue Hydrochlorothiazide 25mg  daily.  Possible Hypothyroidism/Abnormal TSH Recent lab work showed high thyroid levels. No history of thyroid disease. Patient reported feeling a lump in her throat. -Repeat thyroid levels today. -If levels remain high, start Levothyroxine and order thyroid ultrasound. -Follow up in 6 weeks if started on Levothyroxine.  GERD Well controlled, refill of omeprazole   BRONCHITIS Stable. Refill of Tessalon Perles and albuterol inhaler   Immunizations Patient traveling out of country, recommended to receive flu shot today. -Administer high dose flu shot today.  Medication Refills Patient needs refills on Metformin, Jardiance, Hydrochlorothiazide, cholesterol medication, cough medication, Albuterol inhaler, and Omeprazole. -Send refills to [facility] for 90 days supply with refill.  Follow-up Plan  for patient to follow up in four months for chronic conditions. If thyroid levels remain abnormal, follow up in six weeks.      Orders Placed This Encounter  Procedures   Flu Vaccine Trivalent High Dose (Fluad)   TSH   T4, free   Basic Metabolic Panel (BMET)   POCT glycosylated hemoglobin (Hb A1C)   No images are attached to the encounter or orders placed in the encounter. Meds ordered this encounter  Medications   metFORMIN (GLUCOPHAGE) 1000 MG tablet    Sig: Take 1 tablet (1,000 mg total) by mouth 2 (two) times daily.    Dispense:  180 tablet    Refill:  1    Order Specific Question:   Supervising Provider    Answer:    Garnette Gunner [[id number]]   empagliflozin (JARDIANCE) 10 MG TABS tablet    Sig: Take 1 tablet (10 mg total) by mouth daily.    Dispense:  90 tablet    Refill:  1    Order Specific Question:   Supervising Provider    Answer:   Garnette Gunner [[id number]]   atorvastatin (LIPITOR) 10 MG tablet    Sig: Take 1 tablet (10 mg total) by mouth daily.    Dispense:  90 tablet    Refill:  1    Order Specific Question:   Supervising Provider    Answer:   Garnette Gunner [[id number]]   benzonatate (TESSALON) 100 MG capsule    Sig: Take 1 capsule (100 mg total) by mouth 3 (three) times daily as needed for cough.    Dispense:  60 capsule    Refill:  0    Order Specific Question:   Supervising Provider    Answer:   Garnette Gunner [[id number]]   albuterol (VENTOLIN HFA) 108 (90 Base) MCG/ACT inhaler    Sig: Inhale 2 puffs into the lungs every 6 (six) hours as needed for wheezing or shortness of breath.    Dispense:  18 g    Refill:  2    Order Specific Question:   Supervising Provider    Answer:   Garnette Gunner [[id number]]   omeprazole (PRILOSEC) 20 MG capsule    Sig: Take 1 capsule (20 mg total) by mouth daily.    Dispense:  90 capsule    Refill:  1    Order Specific Question:   Supervising Provider    Answer:   Garnette Gunner [[id number]]    Return in about 4 months (around 10/22/2023) for Hypertension, Diabetes, Cholesterol, health maintenance.   Salvatore Decent, FNP

## 2023-07-01 ENCOUNTER — Ambulatory Visit: Payer: Commercial Managed Care - PPO | Admitting: Internal Medicine

## 2023-08-30 ENCOUNTER — Telehealth: Payer: Self-pay | Admitting: Internal Medicine

## 2023-09-14 NOTE — Telephone Encounter (Signed)
 Error

## 2023-09-19 ENCOUNTER — Other Ambulatory Visit: Payer: Self-pay | Admitting: Internal Medicine

## 2023-09-19 DIAGNOSIS — I10 Essential (primary) hypertension: Secondary | ICD-10-CM

## 2023-09-26 ENCOUNTER — Other Ambulatory Visit: Payer: Self-pay | Admitting: Internal Medicine

## 2023-09-26 DIAGNOSIS — E559 Vitamin D deficiency, unspecified: Secondary | ICD-10-CM

## 2023-10-09 ENCOUNTER — Other Ambulatory Visit: Payer: Self-pay | Admitting: Internal Medicine

## 2023-10-09 DIAGNOSIS — J40 Bronchitis, not specified as acute or chronic: Secondary | ICD-10-CM

## 2023-10-28 ENCOUNTER — Ambulatory Visit: Payer: Commercial Managed Care - PPO | Admitting: Internal Medicine

## 2023-11-14 NOTE — Progress Notes (Deleted)
 [facility] [address] [place] [address] Dept: [phone] Dept Fax: [phone]    Subjective:   Kathryn Christian 1956/01/25 11/18/2023  No chief complaint on file.   HPI: Kathryn Christian presents today for re-assessment and management of chronic medical conditions.  Discussed the use of AI scribe software for clinical note transcription with the patient, who gave verbal consent to proceed.  History of Present Illness        The following portions of the patient's history were reviewed and updated as appropriate: past medical history, past surgical history, family history, social history, allergies, medications, and problem list.   Patient Active Problem List   Diagnosis Date Noted   Voice hoarseness 01/28/2023   Chronic cough 01/28/2023   IDA (iron deficiency anemia) 12/31/2022   Essential hypertension 05/01/2019   Type 2 diabetes mellitus without complication, without long-term current use of insulin (HCC) 04/03/2018   Vitamin D deficiency 04/03/2018   High cholesterol 04/03/2018   Cholecystitis 03/23/2018   Epigastric abdominal pain 03/23/2018   Left leg pain 03/23/2018   Obesity (BMI 30-39.9) 03/23/2018   SIRS (systemic inflammatory response syndrome) (HCC) 03/23/2018   Past Medical History:  Diagnosis Date   Diabetes mellitus without complication (HCC)    High cholesterol    Hypertension    Past Surgical History:  Procedure Laterality Date   CHOLECYSTECTOMY N/A 03/23/2018   Procedure: LAPAROSCOPIC CHOLECYSTECTOMY WITH INTRAOPERATIVE CHOLANGIOGRAM;  Surgeon: Darnell Level, MD;  Location: [facility];  Service: General;  Laterality: N/A;   No family history on file.  Current Outpatient Medications:    acetaminophen (TYLENOL) 325 MG tablet, Take 2 tablets (650 mg total) by mouth every 6 (six) hours as needed for mild pain (or Fever >/= 101)., Disp: 20 tablet, Rfl: 0   albuterol (VENTOLIN HFA) 108 (90 Base)  MCG/ACT inhaler, Inhale 2 puffs into the lungs every 6 (six) hours as needed for wheezing or shortness of breath., Disp: 18 g, Rfl: 2   atorvastatin (LIPITOR) 10 MG tablet, Take 1 tablet (10 mg total) by mouth daily., Disp: 90 tablet, Rfl: 1   benzonatate (TESSALON) 100 MG capsule, TAKE 1 CAPSULE BY MOUTH THREE TIMES A DAY AS NEEDED FOR COUGH, Disp: 60 capsule, Rfl: 0   cholecalciferol (VITAMIN D3) 25 MCG (1000 UNIT) tablet, TAKE 1 TABLET BY MOUTH EVERY DAY, Disp: 90 tablet, Rfl: 1   empagliflozin (JARDIANCE) 10 MG TABS tablet, Take 1 tablet (10 mg total) by mouth daily., Disp: 90 tablet, Rfl: 1   hydrochlorothiazide (HYDRODIURIL) 25 MG tablet, TAKE 1 TABLET (25 MG TOTAL) BY MOUTH DAILY. TAKE 25 MG BY MOUTH DAILY., Disp: 90 tablet, Rfl: 1   ipratropium-albuterol (DUONEB) 0.5-2.5 (3) MG/3ML SOLN, Inhale into the lungs 2 (two) times daily., Disp: , Rfl:    Lancets MISC, Use to check blood sugars up to 2 times daily. Diagnosis code: E11.9, Disp: , Rfl:    meloxicam (MOBIC) 15 MG tablet, Take 1 tablet (15 mg total) by mouth as needed. Take by mouth as needed., Disp: 30 tablet, Rfl: 0   metFORMIN (GLUCOPHAGE) 1000 MG tablet, Take 1 tablet (1,000 mg total) by mouth 2 (two) times daily., Disp: 180 tablet, Rfl: 1   omeprazole (PRILOSEC) 20 MG capsule, Take 1 capsule (20 mg total) by mouth daily., Disp: 90 capsule, Rfl: 1 No Known Allergies   ROS: A complete ROS was performed with pertinent positives/negatives noted in the HPI. The remainder of the ROS are negative.  Objective:   There were no vitals filed for this visit.  GENERAL: Well-appearing, in NAD. Well nourished.  SKIN: Pink, warm and dry. No rash, lesion, ulceration, or ecchymoses.  NECK: Trachea midline. Full ROM w/o pain or tenderness. No lymphadenopathy.  RESPIRATORY: Chest wall symmetrical. Respirations even and non-labored. Breath sounds clear to auscultation bilaterally.  CARDIAC: S1, S2 present, regular rate and rhythm. Peripheral  pulses 2+ bilaterally.  MSK: Muscle tone and strength appropriate for age. Joints w/o tenderness, redness, or swelling.  EXTREMITIES: Without clubbing, cyanosis, or edema.  NEUROLOGIC: No motor or sensory deficits. Steady, even gait.  PSYCH/MENTAL STATUS: Alert, oriented x 3. Cooperative, appropriate mood and affect.   Health Maintenance Due  Topic Date Due   Pneumonia Vaccine 11+ Years old (1 of 2 - PCV) Never done   OPHTHALMOLOGY EXAM  Never done   Hepatitis C Screening  Never done   Zoster Vaccines- Shingrix (1 of 2) Never done   COVID-19 Vaccine (4 - 2024-25 season) 04/10/2023    No results found for any visits on 11/18/23.  The 10-year ASCVD risk score (Arnett DK, et al., 2019) is: 16.9%     Assessment & Plan:  Assessment and Plan Assessment & Plan      Essential hypertension  Type 2 diabetes mellitus without complication, without long-term current use of insulin (HCC)  High cholesterol  Need for hepatitis C screening test  Immunization due   No orders of the defined types were placed in this encounter.  No images are attached to the encounter or orders placed in the encounter. No orders of the defined types were placed in this encounter.   No follow-ups on file.   Salvatore Decent, FNP

## 2023-11-18 ENCOUNTER — Ambulatory Visit: Admitting: Internal Medicine

## 2023-11-18 DIAGNOSIS — Z23 Encounter for immunization: Secondary | ICD-10-CM

## 2023-11-18 DIAGNOSIS — Z1159 Encounter for screening for other viral diseases: Secondary | ICD-10-CM

## 2023-11-18 DIAGNOSIS — E7800 Pure hypercholesterolemia, unspecified: Secondary | ICD-10-CM

## 2023-11-18 DIAGNOSIS — E119 Type 2 diabetes mellitus without complications: Secondary | ICD-10-CM

## 2023-11-18 DIAGNOSIS — I10 Essential (primary) hypertension: Secondary | ICD-10-CM

## 2023-12-09 ENCOUNTER — Ambulatory Visit: Admitting: Internal Medicine

## 2023-12-09 ENCOUNTER — Encounter: Payer: Self-pay | Admitting: Internal Medicine

## 2023-12-09 VITALS — BP 137/68 | HR 60 | Temp 98.0°F | Resp 18 | Wt 189.4 lb

## 2023-12-09 DIAGNOSIS — Z7984 Long term (current) use of oral hypoglycemic drugs: Secondary | ICD-10-CM

## 2023-12-09 DIAGNOSIS — E559 Vitamin D deficiency, unspecified: Secondary | ICD-10-CM | POA: Diagnosis not present

## 2023-12-09 DIAGNOSIS — E7800 Pure hypercholesterolemia, unspecified: Secondary | ICD-10-CM

## 2023-12-09 DIAGNOSIS — M542 Cervicalgia: Secondary | ICD-10-CM

## 2023-12-09 DIAGNOSIS — I10 Essential (primary) hypertension: Secondary | ICD-10-CM | POA: Diagnosis not present

## 2023-12-09 DIAGNOSIS — E119 Type 2 diabetes mellitus without complications: Secondary | ICD-10-CM

## 2023-12-09 LAB — COMPREHENSIVE METABOLIC PANEL WITH GFR
ALT: 12 U/L (ref 0–35)
AST: 11 U/L (ref 0–37)
Albumin: 4 g/dL (ref 3.5–5.2)
Alkaline Phosphatase: 57 U/L (ref 39–117)
BUN: 18 mg/dL (ref 6–23)
CO2: 32 meq/L (ref 19–32)
Calcium: 9.1 mg/dL (ref 8.4–10.5)
Chloride: 101 meq/L (ref 96–112)
Creatinine, Ser: 0.76 mg/dL (ref 0.40–1.20)
GFR: 80.85 mL/min (ref >60.00)
Glucose, Bld: 181 mg/dL — ABNORMAL HIGH (ref 70–99)
Potassium: 4.5 meq/L (ref 3.5–5.1)
Sodium: 140 meq/L (ref 135–145)
Total Bilirubin: 0.4 mg/dL (ref 0.2–1.2)
Total Protein: 6.5 g/dL (ref 6.0–8.3)

## 2023-12-09 LAB — LIPID PANEL
Cholesterol: 167 mg/dL (ref 0–200)
HDL: 47.6 mg/dL (ref >39.00)
LDL Cholesterol: 78 mg/dL (ref 0–99)
NonHDL: 119.86
Total CHOL/HDL Ratio: 4
Triglycerides: 211 mg/dL — ABNORMAL HIGH (ref 0.0–149.0)
VLDL: 42.2 mg/dL — ABNORMAL HIGH (ref 0.0–40.0)

## 2023-12-09 LAB — HEMOGLOBIN A1C: Hgb A1c MFr Bld: 10.8 % — ABNORMAL HIGH (ref 4.6–6.5)

## 2023-12-09 LAB — VITAMIN D 25 HYDROXY (VIT D DEFICIENCY, FRACTURES): VITD: 19.39 ng/mL — ABNORMAL LOW (ref 30.00–100.00)

## 2023-12-09 MED ORDER — MELOXICAM 7.5 MG PO TABS: 7.5000 mg | ORAL_TABLET | Freq: Two times a day (BID) | ORAL | 0 refills | Status: AC

## 2023-12-09 NOTE — Patient Instructions (Addendum)
 Biofreeze - apply to neck as needed  Heating pad

## 2023-12-09 NOTE — Progress Notes (Signed)
 [facility] [address] [place] [address] Dept: [phone] Dept Fax: [phone]    Subjective:   Kathryn Christian 09-18-1955 12/09/2023  Chief Complaint  Patient presents with   Medical Management of Chronic Issues    4 month follow up. Pt is fasting today. Rx refills are needed   HM due- vaccination - pt declined, eye exam scheduled for July   Pain    Pt c/o of right side neck pain for 1 month became worse over time; used OTC tylenol  for discomfort.    HPI: Kathryn Christian presents today for re-assessment and management of chronic medical conditions.  Discussed the use of AI scribe software for clinical note transcription with the patient, who gave verbal consent to proceed.  History of Present Illness   Kathryn Christian is a 68 year old female with diabetes who presents for a four-month follow-up visit and reports new onset neck pain.  She has been experiencing right-sided neck pain for the past month, which has worsened over the last week. There is no history of specific injury or event that may have triggered the pain. She has tried home remedies such as sleeping on the floor, applying a heating pad, and taking Tylenol , which have provided minimal relief. She also changed her pillows to alleviate the pain. The pain is exacerbated by head movement, particularly when turning her head. No numbness radiating down her arm is reported.  She is currently taking Jardiance  (empagliflozin ) and metformin  for diabetes management. Her A1c and kidney function are being monitored, and she has an upcoming eye appointment in July. She is fasting today for her blood work, which will include cholesterol levels. She continues to take atorvastatin  for cholesterol management and a vitamin D  supplement for vitamin D  deficiency. Her blood pressure is well-controlled with hydrochlorothiazide .       The following portions of the patient's history were  reviewed and updated as appropriate: past medical history, past surgical history, family history, social history, allergies, medications, and problem list.   Patient Active Problem List   Diagnosis Date Noted   Voice hoarseness 01/28/2023   Chronic cough 01/28/2023   IDA (iron deficiency anemia) 12/31/2022   Essential hypertension 05/01/2019   Type 2 diabetes mellitus without complication, without long-term current use of insulin  (HCC) 04/03/2018   Vitamin D  deficiency 04/03/2018   High cholesterol 04/03/2018   Cholecystitis 03/23/2018   Epigastric abdominal pain 03/23/2018   Left leg pain 03/23/2018   Obesity (BMI 30-39.9) 03/23/2018   SIRS (systemic inflammatory response syndrome) (HCC) 03/23/2018   Past Medical History:  Diagnosis Date   Diabetes mellitus without complication (HCC)    High cholesterol    Hypertension    Past Surgical History:  Procedure Laterality Date   CHOLECYSTECTOMY N/A 03/23/2018   Procedure: LAPAROSCOPIC CHOLECYSTECTOMY WITH INTRAOPERATIVE CHOLANGIOGRAM;  Surgeon: Oralee Billow, MD;  Location: [facility];  Service: General;  Laterality: N/A;   History reviewed. No pertinent family history.  Current Outpatient Medications:    acetaminophen  (TYLENOL ) 325 MG tablet, Take 2 tablets (650 mg total) by mouth every 6 (six) hours as needed for mild pain (or Fever >/= 101)., Disp: 20 tablet, Rfl: 0   atorvastatin  (LIPITOR) 10 MG tablet, Take 1 tablet (10 mg total) by mouth daily., Disp: 90 tablet, Rfl: 1   cholecalciferol (VITAMIN D3) 25 MCG (1000 UNIT) tablet, TAKE 1 TABLET BY MOUTH EVERY DAY, Disp: 90 tablet, Rfl: 1   empagliflozin  (JARDIANCE ) 10 MG TABS tablet,  Take 1 tablet (10 mg total) by mouth daily., Disp: 90 tablet, Rfl: 1   hydrochlorothiazide  (HYDRODIURIL ) 25 MG tablet, TAKE 1 TABLET (25 MG TOTAL) BY MOUTH DAILY. TAKE 25 MG BY MOUTH DAILY., Disp: 90 tablet, Rfl: 1   Lancets MISC, Use to check blood sugars up to 2 times daily. Diagnosis code: E11.9, Disp: ,  Rfl:    meloxicam  (MOBIC ) 7.5 MG tablet, Take 1 tablet (7.5 mg total) by mouth 2 (two) times daily for 14 days., Disp: 28 tablet, Rfl: 0   metFORMIN  (GLUCOPHAGE ) 1000 MG tablet, Take 1 tablet (1,000 mg total) by mouth 2 (two) times daily., Disp: 180 tablet, Rfl: 1   omeprazole  (PRILOSEC) 20 MG capsule, Take 1 capsule (20 mg total) by mouth daily., Disp: 90 capsule, Rfl: 1   albuterol  (VENTOLIN  HFA) 108 (90 Base) MCG/ACT inhaler, Inhale 2 puffs into the lungs every 6 (six) hours as needed for wheezing or shortness of breath. (Patient not taking: Reported on 12/09/2023), Disp: 18 g, Rfl: 2   benzonatate  (TESSALON ) 100 MG capsule, TAKE 1 CAPSULE BY MOUTH THREE TIMES A DAY AS NEEDED FOR COUGH (Patient not taking: Reported on 12/09/2023), Disp: 60 capsule, Rfl: 0   ipratropium-albuterol  (DUONEB) 0.5-2.5 (3) MG/3ML SOLN, Inhale into the lungs 2 (two) times daily., Disp: , Rfl:  No Known Allergies   ROS: A complete ROS was performed with pertinent positives/negatives noted in the HPI. The remainder of the ROS are negative.    Objective:   Today's Vitals   12/09/23 0811  BP: 137/68  Pulse: 60  Resp: 18  Temp: 98 F (36.7 C)  TempSrc: Temporal  SpO2: 97%  Weight: 189 lb 6.4 oz (85.9 kg)  PainSc: 4   PainLoc: Neck    GENERAL: Well-appearing, in NAD. Well nourished.  SKIN: Pink, warm and dry. No rash, lesion, ulceration, or ecchymoses.  NECK: Trachea midline. Pain to R. Lateral neck , worsening with lateral movement. No spinal tenderness, no palpable deformities. No step offs.  No lymphadenopathy.  RESPIRATORY: Chest wall symmetrical. Respirations even and non-labored. Breath sounds clear to auscultation bilaterally.  CARDIAC: S1, S2 present, regular rate and rhythm. Peripheral pulses 2+ bilaterally.  MSK: Muscle tone and strength appropriate for age.  EXTREMITIES: Without clubbing, cyanosis, or edema.  NEUROLOGIC: Steady, even gait.  PSYCH/MENTAL STATUS: Alert, oriented x 3. Cooperative,  appropriate mood and affect.   Health Maintenance Due  Topic Date Due   OPHTHALMOLOGY EXAM  Never done    No results found for any visits on 12/09/23.  The 10-year ASCVD risk score (Arnett DK, et al., 2019) is: 18.6%     Assessment & Plan:  Assessment and Plan    Neck strain Right-sided neck strain, likely muscular, with minimal relief from current management. - Prescribe meloxicam  7.5mg  twice daily for 2 weeks. - Continue heating pad. - Advise neck stretches. - Recommend OTC Biofreeze.  Type 2 diabetes mellitus Type 2 diabetes managed with empagliflozin  and metformin . A1c testing today. Eye appointment scheduled for July. - Check A1c today. - Continue empagliflozin . - Continue metformin . - Monitor kidney function. - Ensure eye appointment in July.  Hypertension Hypertension well-controlled with hydrochlorothiazide . - Continue hydrochlorothiazide .  Hyperlipidemia Hyperlipidemia managed with atorvastatin . Cholesterol levels to be checked today. - Check cholesterol levels today. - Continue atorvastatin .  Vitamin D  deficiency Vitamin D  deficiency managed with supplementation. Levels to be checked today. - Check vitamin D  levels today. - Continue vitamin D  supplementation.        Orders Placed  This Encounter  Procedures   Comprehensive metabolic panel with GFR   Lipid panel   Hemoglobin A1C   VITAMIN D  25 Hydroxy (Vit-D Deficiency, Fractures)   No images are attached to the encounter or orders placed in the encounter. Meds ordered this encounter  Medications   meloxicam  (MOBIC ) 7.5 MG tablet    Sig: Take 1 tablet (7.5 mg total) by mouth 2 (two) times daily for 14 days.    Dispense:  28 tablet    Refill:  0    Supervising Provider:   Catheryn Cluck [[id number]]    Return in about 6 months (around 06/10/2024) for Chronic Condition follow up.   Gavin Kast, FNP

## 2023-12-12 ENCOUNTER — Other Ambulatory Visit: Payer: Self-pay | Admitting: Internal Medicine

## 2023-12-12 DIAGNOSIS — E119 Type 2 diabetes mellitus without complications: Secondary | ICD-10-CM

## 2023-12-12 DIAGNOSIS — E559 Vitamin D deficiency, unspecified: Secondary | ICD-10-CM

## 2023-12-12 MED ORDER — EMPAGLIFLOZIN 25 MG PO TABS: 25.0000 mg | ORAL_TABLET | Freq: Every day | ORAL | 1 refills | Status: AC

## 2023-12-12 MED ORDER — VITAMIN D (ERGOCALCIFEROL) 1.25 MG (50000 UNIT) PO CAPS: 50000.0000 [IU] | ORAL_CAPSULE | ORAL | 1 refills | Status: DC

## 2024-01-06 ENCOUNTER — Other Ambulatory Visit: Payer: Self-pay | Admitting: Internal Medicine

## 2024-01-06 DIAGNOSIS — E7800 Pure hypercholesterolemia, unspecified: Secondary | ICD-10-CM

## 2024-03-08 ENCOUNTER — Ambulatory Visit: Payer: Self-pay

## 2024-03-08 NOTE — Telephone Encounter (Signed)
 FYI Only or Action Required?: FYI only for provider.  Patient was last seen in primary care on 12/09/2023 by Billy Knee, FNP.  Called Nurse Triage reporting Neck Pain.  Symptoms began several months ago.  Interventions attempted: OTC medications: Tylenol , Prescription medications: Meloxicam  x 7 days previously, Rest, hydration, or home remedies, and Ice/heat application.  Symptoms are: gradually worsening.  Triage Disposition: See PCP Within 2 Weeks  Patient/caregiver understands and will follow disposition?: Yes Reason for Disposition  Neck pain is a chronic symptom (recurrent or ongoing AND present > 4 weeks)  Answer Assessment - Initial Assessment Questions Due to severity of pain, scheduled soonest available in office visit but advised she can be evaluated at [facility] today if she would prefer a sooner evaluation. Daughter agreed and will call if they no longer require the PCP visit. Kept for now in case requires f/u.  1. ONSET: When did the pain begin?      April 2025  2. LOCATION: Where does it hurt?      Right side  3. PATTERN Does the pain come and go, or has it been constant since it started?      Intermittent, worsening to more constant  4. SEVERITY: How bad is the pain?  (Scale 0-10; or none or slight stiffness, mild, moderate, severe)     Not with patient to rate specifically, but states she is very uncomfortable  5. RADIATION: Does the pain go anywhere else, shoot into your arms?     Denies  6. CORD SYMPTOMS: Any weakness or numbness of the arms or legs?     Denies  7. CAUSE: What do you think is causing the neck pain?     Believes she slept on it wrong initially several months ago  8. NECK OVERUSE: Any recent activities that involved turning or twisting the neck?     Denies  9. OTHER SYMPTOMS: Do you have any other symptoms? (e.g., headache, fever, chest pain, difficulty breathing, neck swelling)     Denies  Protocols used: Neck Pain or  Stiffness-A-AH Copied from CRM #[id number]. Topic: Clinical - Red Word Triage >> Mar 08, 2024  3:31 PM Jayma L wrote: Red Word that prompted transfer to Nurse Triage: patient is having right side neck pain that's getting worse, said she can't take the pain anymore. Been using bio freeze and its not helping at all. Patient daughter is on line Saia

## 2024-03-13 ENCOUNTER — Ambulatory Visit: Admitting: Family Medicine

## 2024-03-13 ENCOUNTER — Encounter: Payer: Self-pay | Admitting: Family Medicine

## 2024-03-13 VITALS — BP 122/80 | HR 94 | Temp 97.8°F | Ht 63.0 in | Wt 198.0 lb

## 2024-03-13 DIAGNOSIS — S161XXD Strain of muscle, fascia and tendon at neck level, subsequent encounter: Secondary | ICD-10-CM | POA: Diagnosis not present

## 2024-03-13 MED ORDER — METHOCARBAMOL 500 MG PO TABS: 500.0000 mg | ORAL_TABLET | Freq: Three times a day (TID) | ORAL | 0 refills | Status: AC | PRN

## 2024-03-13 MED ORDER — MELOXICAM 15 MG PO TABS: 15.0000 mg | ORAL_TABLET | Freq: Every day | ORAL | 0 refills | Status: DC

## 2024-03-13 NOTE — Progress Notes (Unsigned)
 Established Patient Office Visit   Subjective:  Patient ID: Kathryn Christian, female    DOB: Dec 11, 1955  Age: 68 y.o. MRN: [id number]  Chief Complaint  Patient presents with  . Neck Pain    For 2 weeks back of neck on right side under chin to center,  used Biofreeze,  Meloxicam , and exercise.     HPI Encounter Diagnoses  Name Primary?  . Strain of neck muscle, subsequent encounter Yes   *** {History (Optional):23778}  ROS   Current Outpatient Medications:  .  acetaminophen  (TYLENOL ) 325 MG tablet, Take 2 tablets (650 mg total) by mouth every 6 (six) hours as needed for mild pain (or Fever >/= 101)., Disp: 20 tablet, Rfl: 0 .  atorvastatin  (LIPITOR) 10 MG tablet, TAKE 1 TABLET BY MOUTH EVERY DAY, Disp: 90 tablet, Rfl: 1 .  cholecalciferol (VITAMIN D3) 25 MCG (1000 UNIT) tablet, TAKE 1 TABLET BY MOUTH EVERY DAY, Disp: 90 tablet, Rfl: 1 .  empagliflozin  (JARDIANCE ) 25 MG TABS tablet, Take 1 tablet (25 mg total) by mouth daily before breakfast., Disp: 90 tablet, Rfl: 1 .  hydrochlorothiazide  (HYDRODIURIL ) 25 MG tablet, TAKE 1 TABLET (25 MG TOTAL) BY MOUTH DAILY. TAKE 25 MG BY MOUTH DAILY., Disp: 90 tablet, Rfl: 1 .  Lancets MISC, Use to check blood sugars up to 2 times daily. Diagnosis code: E11.9, Disp: , Rfl:  .  meloxicam  (MOBIC ) 15 MG tablet, Take 1 tablet (15 mg total) by mouth daily., Disp: 30 tablet, Rfl: 0 .  methocarbamol  (ROBAXIN ) 500 MG tablet, Take 1 tablet (500 mg total) by mouth every 8 (eight) hours as needed., Disp: 30 tablet, Rfl: 0 .  omeprazole  (PRILOSEC) 20 MG capsule, Take 1 capsule (20 mg total) by mouth daily., Disp: 90 capsule, Rfl: 1 .  Vitamin D , Ergocalciferol , (DRISDOL ) 1.25 MG (50000 UNIT) CAPS capsule, Take 1 capsule (50,000 Units total) by mouth every 7 (seven) days., Disp: 12 capsule, Rfl: 1 .  albuterol  (VENTOLIN  HFA) 108 (90 Base) MCG/ACT inhaler, Inhale 2 puffs into the lungs every 6 (six) hours as needed for wheezing or shortness of breath. (Patient not  taking: Reported on 12/09/2023), Disp: 18 g, Rfl: 2 .  benzonatate  (TESSALON ) 100 MG capsule, TAKE 1 CAPSULE BY MOUTH THREE TIMES A DAY AS NEEDED FOR COUGH (Patient not taking: Reported on 12/09/2023), Disp: 60 capsule, Rfl: 0 .  ipratropium-albuterol  (DUONEB) 0.5-2.5 (3) MG/3ML SOLN, Inhale into the lungs 2 (two) times daily., Disp: , Rfl:  .  metFORMIN  (GLUCOPHAGE ) 1000 MG tablet, Take 1 tablet (1,000 mg total) by mouth 2 (two) times daily., Disp: 180 tablet, Rfl: 1   Objective:     BP 122/80 (BP Location: Right Arm, Patient Position: Sitting, Cuff Size: Normal)   Pulse 94   Temp 97.8 F (36.6 C) (Temporal)   Ht 5' 3 (1.6 m)   Wt 198 lb (89.8 kg)   SpO2 98%   BMI 35.07 kg/m  {Vitals History (Optional):23777}  Physical Exam   No results found for any visits on 03/13/24.  {Labs (Optional):23779}  The 10-year ASCVD risk score (Arnett DK, et al., 2019) is: 16.8%    Assessment & Plan:   Strain of neck muscle, subsequent encounter -     Meloxicam ; Take 1 tablet (15 mg total) by mouth daily.  Dispense: 30 tablet; Refill: 0 -     Methocarbamol ; Take 1 tablet (500 mg total) by mouth every 8 (eight) hours as needed.  Dispense: 30 tablet; Refill: 0 -  Ambulatory referral to Physical Therapy    Return Follow-up with Rosina as planned.SABRA Elsie Sim Berneta, MD

## 2024-03-15 ENCOUNTER — Encounter: Payer: Self-pay | Admitting: Family Medicine

## 2024-04-03 NOTE — Therapy (Signed)
 OUTPATIENT PHYSICAL THERAPY CERVICAL EVALUATION   Patient Name: Kathryn Christian MRN: [id number] DOB:28-Jun-1956, 68 y.o., female Today's Date: 04/04/2024  END OF SESSION:  PT End of Session - 04/04/24 1558     Visit Number 1    Date for PT Re-Evaluation 05/02/24    Authorization Type [employer]    PT Start Time 1600    PT Stop Time 1630    PT Time Calculation (min) 30 min          Past Medical History:  Diagnosis Date   Diabetes mellitus without complication (HCC)    High cholesterol    Hypertension    Past Surgical History:  Procedure Laterality Date   CHOLECYSTECTOMY N/A 03/23/2018   Procedure: LAPAROSCOPIC CHOLECYSTECTOMY WITH INTRAOPERATIVE CHOLANGIOGRAM;  Surgeon: Eletha Boas, MD;  Location: [facility];  Service: General;  Laterality: N/A;   Patient Active Problem List   Diagnosis Date Noted   Voice hoarseness 01/28/2023   Chronic cough 01/28/2023   IDA (iron deficiency anemia) 12/31/2022   Essential hypertension 05/01/2019   Type 2 diabetes mellitus without complication, without long-term current use of insulin  (HCC) 04/03/2018   Vitamin D  deficiency 04/03/2018   High cholesterol 04/03/2018   Cholecystitis 03/23/2018   Epigastric abdominal pain 03/23/2018   Left leg pain 03/23/2018   Obesity (BMI 30-39.9) 03/23/2018   SIRS (systemic inflammatory response syndrome) (HCC) 03/23/2018    PCP: Rosina Senters  REFERRING PROVIDER: Elsie Lent  REFERRING DIAG:  S16.1XXD (ICD-10-CM) - Strain of neck muscle, subsequent encounter    THERAPY DIAG:  Neck pain  Cervicalgia  Strain of neck muscle, subsequent encounter  Rationale for Evaluation and Treatment: Rehabilitation  ONSET DATE: about 5 months   SUBJECTIVE:                                                                                                                                                                                                         SUBJECTIVE STATEMENT: Daughter reports she has been neck  pain on the R side. It has been about 5 months. They put her on Meloxicam  but her pain is still there.    PERTINENT HISTORY:  DM, HTN, high cholesterol   PAIN:  Are you having pain? Yes: NPRS scale: 10/10 Pain location: R side  Pain description: R side and goes to chin, sharp pain Aggravating factors: when switching sides when sleeping, turning head Relieving factors: pain meds  PRECAUTIONS: None  RED FLAGS: None     WEIGHT BEARING RESTRICTIONS: No  FALLS:  Has patient fallen in last 6 months? No  LIVING ENVIRONMENT: Lives with: lives with their family Lives in: House/apartment  OCCUPATION: not working  PLOF: Independent  PATIENT GOALS: to not have pain   NEXT MD VISIT:   OBJECTIVE:  Note: Objective measures were completed at Evaluation unless otherwise noted.  DIAGNOSTIC FINDINGS:  None for neck  COGNITION: Overall cognitive status: Within functional limits for tasks assessed  SENSATION: WFL  POSTURE: rounded shoulders and forward head  PALPATION: No TTP, tightness in upper traps, some trigger points noted   CERVICAL ROM:   Active ROM A/PROM (deg) eval  Flexion WFL no pain  Extension 50% with pain  Right lateral flexion 25% with pain  Left lateral flexion 25% with pain  Right rotation 50% with pain  Left rotation 25% with pain   (Blank rows = not tested)  UPPER EXTREMITY ROM: all WFL    UPPER EXTREMITY MMT:  MMT Right eval Left eval  Shoulder flexion 3+ 3+  Shoulder extension    Shoulder abduction 3+ 3+  Shoulder adduction    Shoulder extension    Shoulder internal rotation 5 5  Shoulder external rotation 4 4  Middle trapezius    Lower trapezius    Elbow flexion    Elbow extension    Wrist flexion    Wrist extension    Wrist ulnar deviation    Wrist radial deviation    Wrist pronation    Wrist supination    Grip strength     (Blank rows = not tested)    TREATMENT DATE: 04/04/24- EVAL, POC, HEP                                                                                                                                 PATIENT EDUCATION:  Education details: POC, HEP Person educated: Patient and Child(ren) Education method: Medical illustrator Education comprehension: verbalized understanding, returned demonstration, and needs further education  HOME EXERCISE PROGRAM: Access Code: [email or web address] URL: [email or web address] Date: 04/04/2024 Prepared by: Almetta Fam  Exercises - Seated Cervical Retraction  - 1 x daily - 7 x weekly - 2 sets - 10 reps - Seated Upper Trapezius Stretch  - 1 x daily - 7 x weekly - 2 reps - 30 hold - Seated Levator Scapulae Stretch  - 1 x daily - 7 x weekly - 2 reps - 30 hold - Standing Shoulder Horizontal Abduction with Resistance  - 1 x daily - 7 x weekly - 2 sets - 10 reps  ASSESSMENT:  CLINICAL IMPRESSION: Patient is a 68 y.o. female who was seen today for physical therapy evaluation and treatment for neck strain. Her daughter was present at eval to translate. She reports the pain began about 5 months ago. No known trauma that caused it, and reports she just woke up one day with the pain. Pt is tight in upper traps and has trigger points. She does not have pain with palpation. However, her ROM is limited and  she does have pain. Patient reports with pain medication the pain alleviates but if she does not take it, the pain returns. She is also weak in upper extremities and reports pain with resisted muscle testing. She will benefit from skilled PT to address her neck pain to be able to achieve full ROM and complete her daily chores without pain.   OBJECTIVE IMPAIRMENTS: decreased ROM, decreased strength, impaired flexibility, postural dysfunction, and pain.   ACTIVITY LIMITATIONS: carrying, lifting, and sleeping  PARTICIPATION LIMITATIONS: meal prep and cleaning  PERSONAL FACTORS: Age and Time since onset of injury/illness/exacerbation are also affecting  patient's functional outcome.   REHAB POTENTIAL: Good  CLINICAL DECISION MAKING: Stable/uncomplicated  EVALUATION COMPLEXITY: Low  GOALS: Goals reviewed with patient? Yes  SHORT TERM GOALS: Target date: 05/02/24  Patient will be independent with initial HEP.  Baseline:  Goal status: INITIAL   LONG TERM GOALS: Target date: 05/30/24  Patient will be independent with advanced/ongoing HEP to improve outcomes and carryover.  Baseline:  Goal status: INITIAL  2.  Patient will report 75% improvement in neck pain to improve QOL.  Baseline: 10/10 at eval and w/o meds Goal status: INITIAL  3.  Patient will demonstrate full pain free cervical ROM  Baseline: see chart Goal status: INITIAL  4.  Patient will improve shoulder flexion and abduction strength to 4+/5 or better Baseline: 3+ with pain Goal status: INITIAL  PLAN:  PT FREQUENCY: 1x/week  PT DURATION: 8 weeks  PLANNED INTERVENTIONS: 97110-Therapeutic exercises, 97530- Therapeutic activity, 97112- Neuromuscular re-education, 97535- Self Care, 02859- Manual therapy, [id number]- Electrical stimulation (unattended), 97035- Ultrasound, 02987- Traction (mechanical), 20560 (1-2 muscles), 20561 (3+ muscles)- Dry Needling, Patient/Family education, Taping, Joint mobilization, Spinal mobilization, Cryotherapy, and Moist heat  PLAN FOR NEXT SESSION: neck stretching and strengthening- chin tucks, shrugs, UE strengthening, STM and passive ROM to [employer], PT 04/04/2024, 4:34 PM

## 2024-04-04 ENCOUNTER — Other Ambulatory Visit: Payer: Self-pay | Admitting: Internal Medicine

## 2024-04-04 ENCOUNTER — Ambulatory Visit: Attending: Family Medicine

## 2024-04-04 DIAGNOSIS — S161XXD Strain of muscle, fascia and tendon at neck level, subsequent encounter: Secondary | ICD-10-CM | POA: Diagnosis present

## 2024-04-04 DIAGNOSIS — I10 Essential (primary) hypertension: Secondary | ICD-10-CM

## 2024-04-04 DIAGNOSIS — M542 Cervicalgia: Secondary | ICD-10-CM | POA: Diagnosis present

## 2024-04-04 DIAGNOSIS — E1165 Type 2 diabetes mellitus with hyperglycemia: Secondary | ICD-10-CM

## 2024-04-04 DIAGNOSIS — E559 Vitamin D deficiency, unspecified: Secondary | ICD-10-CM

## 2024-04-13 ENCOUNTER — Ambulatory Visit

## 2024-04-19 ENCOUNTER — Ambulatory Visit: Admitting: Physical Therapy

## 2024-04-25 ENCOUNTER — Ambulatory Visit: Payer: Self-pay

## 2024-04-25 ENCOUNTER — Ambulatory Visit: Admitting: Physical Therapy

## 2024-04-25 ENCOUNTER — Other Ambulatory Visit: Payer: Self-pay | Admitting: Internal Medicine

## 2024-04-25 DIAGNOSIS — S161XXD Strain of muscle, fascia and tendon at neck level, subsequent encounter: Secondary | ICD-10-CM

## 2024-04-25 MED ORDER — MELOXICAM 15 MG PO TABS: 15.0000 mg | ORAL_TABLET | Freq: Every day | ORAL | 0 refills | Status: DC | PRN

## 2024-04-25 NOTE — Telephone Encounter (Signed)
 FYI Only or Action Required?: FYI only for provider.  Patient was last seen in primary care on 03/13/2024 by Berneta Elsie Sayre, MD.  Called Nurse Triage reporting Neck Pain.  Symptoms began several months ago.  Interventions attempted: Nothing.  Symptoms are: gradually worsening.  Triage Disposition: See PCP Within 2 Weeks, See PCP When Office is Open (Within 3 Days)  Patient/caregiver understands and will follow disposition?: Yes      Copied from CRM [id number]. Topic: Clinical - Red Word Triage >> Apr 25, 2024  2:42 PM Robinson H wrote: [facility] that prompted transfer to Nurse Triage: Patient is having some neck pain Reason for Disposition  [1] MODERATE neck pain (e.g., interferes with normal activities) AND [2] present > 3 days  Neck pain is a chronic symptom (recurrent or ongoing AND present > 4 weeks)  Answer Assessment - Initial Assessment Questions 1. ONSET: When did the pain begin?      Year ago 2. LOCATION: Where does it hurt?     Right side of neck 3. PATTERN Does the pain come and go, or has it been constant since it started?      constant 4. SEVERITY: How bad is the pain?  (Scale 0-10; or none or slight stiffness, mild, moderate, severe)     severe 5. RADIATION: Does the pain go anywhere else, shoot into your arms?     denies 6. CORD SYMPTOMS: Any weakness or numbness of the arms or legs?     denies 7. CAUSE: What do you think is causing the neck pain?     unknown 8. NECK OVERUSE: Any recent activities that involved turning or twisting the neck?     denies 9. OTHER SYMPTOMS: Do you have any other symptoms? (e.g., headache, fever, chest pain, difficulty breathing, neck swelling)     denies 10. PREGNANCY: Is there any chance you are pregnant? When was your last menstrual period?       na  Protocols used: Neck Pain or Stiffness-A-AH

## 2024-04-25 NOTE — Telephone Encounter (Signed)
 FYI Only or Action Required?: Action required by provider: medication refill request.  Patient was last seen in primary care on 03/13/2024 by Berneta Elsie Sayre, MD.  Called Nurse Triage reporting No chief complaint on file..  Symptoms began today.  Interventions attempted: Nothing.  Symptoms are: unchanged.  Triage Disposition: No disposition on file.  Patient/caregiver understands and will follow disposition?:

## 2024-05-02 ENCOUNTER — Ambulatory Visit

## 2024-05-09 ENCOUNTER — Ambulatory Visit

## 2024-05-23 ENCOUNTER — Ambulatory Visit (INDEPENDENT_AMBULATORY_CARE_PROVIDER_SITE_OTHER)

## 2024-05-23 ENCOUNTER — Encounter: Payer: Self-pay | Admitting: Internal Medicine

## 2024-05-23 ENCOUNTER — Ambulatory Visit: Admitting: Internal Medicine

## 2024-05-23 DIAGNOSIS — M542 Cervicalgia: Secondary | ICD-10-CM

## 2024-05-23 MED ORDER — MELOXICAM 15 MG PO TABS: 15.0000 mg | ORAL_TABLET | Freq: Every day | ORAL | 0 refills | Status: DC | PRN

## 2024-05-23 NOTE — Patient Instructions (Signed)
 Go for xray

## 2024-05-23 NOTE — Progress Notes (Signed)
 [facility] PRIMARY [facility]ARE [facility] [address] [place] [address] Dept: [phone] Dept Fax: [phone]  Acute Care Office Visit  Subjective:   Kathryn Christian 1956-06-15 05/23/2024  Chief Complaint  Patient presents with   Neck Pain    Right side back of neck and shoulder    Due to language barrier, a medical interpreter was present during the HPI, ROS, and discussion for the plan of care.  Interpreter: Daughter Surveyor, minerals   HPI:  History of Present Illness   Kathryn Christian is a 68 year old female who presents with persistent right-sided neck pain for five months.  She has been experiencing right-sided neck pain since May 2025. Initially, she was prescribed meloxicam  7.5 mg twice daily for two weeks, along with the use of a heating pad, neck stretches, and over-the-counter biofreeze.  In August 2025, due to persistent pain, she was seen by Dr. Berneta with primary care. She was given meloxicam  15 mg once daily, and Robaxin  500 mg every eight hours as needed was added. She was also referred to physical therapy.  She attended an initial physical therapy evaluation on April 04, 2024, and was advised to undergo therapy once a week for eight weeks. However, she did not continue with further appointments as her daughter states the patient could not do the neck exercises.   The neck pain only subsides with meloxicam  and not with Tylenol . A heating pad provides minimal relief, and methocarbamol  has not been helpful.  The pain starts from the right side of her neck and goes downwards towards right shoulder, as described by the patient. She experiences pain when turning her head to the right and when bending her chin to her chest, but not when turning to the left or bending her ear to her shoulder on the left side.      The following portions of the patient's history were reviewed and updated as appropriate: past medical history, past surgical history, family  history, social history, allergies, medications, and problem list.   Patient Active Problem List   Diagnosis Date Noted   Voice hoarseness 01/28/2023   Chronic cough 01/28/2023   IDA (iron deficiency anemia) 12/31/2022   Essential hypertension 05/01/2019   Type 2 diabetes mellitus without complication, without long-term current use of insulin  (HCC) 04/03/2018   Vitamin D  deficiency 04/03/2018   High cholesterol 04/03/2018   Cholecystitis 03/23/2018   Epigastric abdominal pain 03/23/2018   Left leg pain 03/23/2018   Obesity (BMI 30-39.9) 03/23/2018   SIRS (systemic inflammatory response syndrome) (HCC) 03/23/2018   Past Medical History:  Diagnosis Date   Diabetes mellitus without complication (HCC)    High cholesterol    Hypertension    Past Surgical History:  Procedure Laterality Date   CHOLECYSTECTOMY N/A 03/23/2018   Procedure: LAPAROSCOPIC CHOLECYSTECTOMY WITH INTRAOPERATIVE CHOLANGIOGRAM;  Surgeon: Eletha Boas, MD;  Location: [facility];  Service: General;  Laterality: N/A;   History reviewed. No pertinent family history.  Current Outpatient Medications:    acetaminophen  (TYLENOL ) 325 MG tablet, Take 2 tablets (650 mg total) by mouth every 6 (six) hours as needed for mild pain (or Fever >/= 101)., Disp: 20 tablet, Rfl: 0   atorvastatin  (LIPITOR) 10 MG tablet, TAKE 1 TABLET BY MOUTH EVERY DAY, Disp: 90 tablet, Rfl: 1   cholecalciferol (VITAMIN D3) 25 MCG (1000 UNIT) tablet, TAKE 1 TABLET BY MOUTH EVERY DAY, Disp: 90 tablet, Rfl: 1   empagliflozin  (JARDIANCE ) 25 MG TABS tablet, Take 1 tablet (25  mg total) by mouth daily before breakfast., Disp: 90 tablet, Rfl: 1   hydrochlorothiazide  (HYDRODIURIL ) 25 MG tablet, TAKE 1 TABLET (25 MG TOTAL) BY MOUTH DAILY. TAKE 25 MG BY MOUTH DAILY., Disp: 90 tablet, Rfl: 1   ipratropium-albuterol  (DUONEB) 0.5-2.5 (3) MG/3ML SOLN, Inhale into the lungs 2 (two) times daily., Disp: , Rfl:    Lancets MISC, Use to check blood sugars up to 2 times daily.  Diagnosis code: E11.9, Disp: , Rfl:    metFORMIN  (GLUCOPHAGE ) 1000 MG tablet, TAKE 1 TABLET BY MOUTH TWICE A DAY, Disp: 180 tablet, Rfl: 1   methocarbamol  (ROBAXIN ) 500 MG tablet, Take 1 tablet (500 mg total) by mouth every 8 (eight) hours as needed., Disp: 30 tablet, Rfl: 0   Vitamin D , Ergocalciferol , (DRISDOL ) 1.25 MG (50000 UNIT) CAPS capsule, Take 1 capsule (50,000 Units total) by mouth every 7 (seven) days., Disp: 12 capsule, Rfl: 1   albuterol  (VENTOLIN  HFA) 108 (90 Base) MCG/ACT inhaler, Inhale 2 puffs into the lungs every 6 (six) hours as needed for wheezing or shortness of breath. (Patient not taking: Reported on 12/09/2023), Disp: 18 g, Rfl: 2   benzonatate  (TESSALON ) 100 MG capsule, TAKE 1 CAPSULE BY MOUTH THREE TIMES A DAY AS NEEDED FOR COUGH (Patient not taking: Reported on 12/09/2023), Disp: 60 capsule, Rfl: 0   meloxicam  (MOBIC ) 15 MG tablet, Take 1 tablet (15 mg total) by mouth daily as needed for pain., Disp: 30 tablet, Rfl: 0   omeprazole  (PRILOSEC) 20 MG capsule, Take 1 capsule (20 mg total) by mouth daily. (Patient not taking: Reported on 05/23/2024), Disp: 90 capsule, Rfl: 1 No Known Allergies   ROS: A complete ROS was performed with pertinent positives/negatives noted in the HPI. The remainder of the ROS are negative.    Objective:   Today's Vitals   05/23/24 1019  BP: 122/80  Pulse: 85  Temp: 97.7 F (36.5 C)  TempSrc: Temporal  SpO2: 96%  Weight: 198 lb (89.8 kg)  Height: 5' 3 (1.6 m)    GENERAL: Well-appearing, in NAD. Well nourished.  SKIN: Pink, warm and dry. No rash, lesion, ulceration, or ecchymoses.  NECK: Trachea midline. No lymphadenopathy. Pain to right side of neck with flexion, extension, and left lateral rotation. No spinal tenderness or palpable deformities.  RESPIRATORY: Chest wall symmetrical. Respirations even and non-labored.  EXTREMITIES: Without clubbing, cyanosis, or edema.  NEUROLOGIC: No motor or sensory deficits. Steady, even gait.   PSYCH/MENTAL STATUS: Alert, oriented x 3. Cooperative, appropriate mood and affect.    No results found for any visits on 05/23/24.    Assessment & Plan:  Assessment and Plan    Chronic right-sided neck pain Chronic neck pain for five months, relieved by meloxicam . Differential includes cervical spondylosis or strain. X-ray and orthopedic evaluation needed since no relief with primary care interventions. Patient states unable to tolerate PT exercises.  - Order cervical spine x-ray. - Refer to orthopedics for evaluation. - Prescribe meloxicam  15 mg once daily.     Meds ordered this encounter  Medications   meloxicam  (MOBIC ) 15 MG tablet    Sig: Take 1 tablet (15 mg total) by mouth daily as needed for pain.    Dispense:  30 tablet    Refill:  0    Supervising Provider:   THOMPSON, AARON B [[id number]]   Orders Placed This Encounter  Procedures   DG Cervical Spine Complete    Standing Status:   Future    Number of Occurrences:   1  Expiration Date:   05/23/2025    Preferred imaging location?:   [facility]             Please include AP, lateral, obliques, and odontoid views.    Reason for exam::   Please include AP, lateral, obliques, and odontoid views.             Please include AP, lateral, obliques, and odontoid views.    Release to patient:   Immediate   Ambulatory referral to Orthopedics    Referral Priority:   Routine    Referral Type:   Consultation    Number of Visits Requested:   1   Lab Orders  No laboratory test(s) ordered today   No images are attached to the encounter or orders placed in the encounter.  Return for Scheduled Routine Office Visits and as needed.   Rosina Senters, FNP

## 2024-05-28 ENCOUNTER — Ambulatory Visit: Payer: Self-pay | Admitting: Internal Medicine

## 2024-05-28 DIAGNOSIS — M5030 Other cervical disc degeneration, unspecified cervical region: Secondary | ICD-10-CM | POA: Insufficient documentation

## 2024-05-28 NOTE — Telephone Encounter (Signed)
 Spoke with pt daughter she relayed message. She asked if this could be treated. I told her I would speak with provider and follow up with her.

## 2024-05-28 NOTE — Telephone Encounter (Signed)
 Patient has been notified directly; all questions, if any, were answered. Patient voiced understanding.  Spoke with daughter answered her question she would follow-up with orthopedics.

## 2024-06-06 ENCOUNTER — Other Ambulatory Visit: Payer: Self-pay | Admitting: Internal Medicine

## 2024-06-06 DIAGNOSIS — E559 Vitamin D deficiency, unspecified: Secondary | ICD-10-CM

## 2024-06-08 ENCOUNTER — Ambulatory Visit (INDEPENDENT_AMBULATORY_CARE_PROVIDER_SITE_OTHER): Admitting: Sports Medicine

## 2024-06-08 ENCOUNTER — Encounter: Payer: Self-pay | Admitting: Sports Medicine

## 2024-06-08 DIAGNOSIS — M5030 Other cervical disc degeneration, unspecified cervical region: Secondary | ICD-10-CM

## 2024-06-08 DIAGNOSIS — M542 Cervicalgia: Secondary | ICD-10-CM

## 2024-06-08 DIAGNOSIS — M62838 Other muscle spasm: Secondary | ICD-10-CM

## 2024-06-08 MED ORDER — BUPIVACAINE HCL 0.25 % IJ SOLN
0.5000 mL | INTRAMUSCULAR | Status: AC | PRN
  Administered 2024-06-08: .5 mL

## 2024-06-08 MED ORDER — LIDOCAINE HCL 1 % IJ SOLN
0.5000 mL | INTRAMUSCULAR | Status: AC | PRN
  Administered 2024-06-08: .5 mL

## 2024-06-08 MED ORDER — METHYLPREDNISOLONE ACETATE 40 MG/ML IJ SUSP
20.0000 mg | INTRAMUSCULAR | Status: AC | PRN
  Administered 2024-06-08: 20 mg via INTRAMUSCULAR

## 2024-06-08 MED ORDER — MELOXICAM 15 MG PO TABS: 15.0000 mg | ORAL_TABLET | Freq: Every day | ORAL | 1 refills | Status: AC | PRN

## 2024-06-08 NOTE — Progress Notes (Signed)
 Patient says that she has had neck pain since March. Her pain is on the right side of her neck only, and she denies any symptoms in the right arm. She has more pain when the right side of her neck is put into a stretched position, although she does have pain at rest when she sits in a neutral position. She has done physical therapy, although the exercises seemed to make her pain worse. She says that she takes Meloxicam  once  a day, which is very helpful.

## 2024-06-08 NOTE — Progress Notes (Signed)
 Kathryn Christian - 68 y.o. female MRN [id number]  Date of birth: 21-Sep-1955  Office Visit Note: Visit Date: 06/08/2024 PCP: Billy Knee, FNP Referred by: Billy Knee, FNP  Subjective: Chief Complaint  Patient presents with   Neck - Pain   HPI: Kathryn Christian is a pleasant 68 y.o. female who presents today for evaluation of chronic neck pain.  An in person interpreter was at the beginning of the visit today, but per discussion per the patient's daughter and the interpreter, the patient did not need translation services.  She has had neck pain since March, this was insidious onset with no specific injury.  Pain is on the right side of the neck which feels stiffness, restricted.  She has no numbness or tingling or symptoms down the right upper extremity.  Her pain is worse when rotating or stretching that side of her neck.  She did do 1 session of formalized physical therapy but this seemed to worsen her pain.  She recently began exercising taking meloxicam  15 mg once daily with which has been significantly helpful for her.  Pertinent ROS were reviewed with the patient and found to be negative unless otherwise specified above in HPI.   Assessment & Plan: Visit Diagnoses:  1. DDD (degenerative disc disease), cervical   2. Neck pain   3. Trigger point of neck   4. Trapezius muscle spasm   5. Neck pain on right side    Plan: Impression is chronic neck pain, most severe on the right side with rather advanced degenerative disc disease at the C5-C7 level with reciprocal right sided paraspinal and trapezius hypertonicity.  Discussed the nature of her degenerative disc disease with associated arthritic change.  Discussed all treatment options.  Given that she has trigger point and muscle hypertonicity of the paraspinals and trapezius, we gave proceed with trigger point injections, patient tolerated well.  She has found great relief from meloxicam  use, she will continue this 15 mg once daily.  Will avoid  any type of prednisone  given her uncontrolled diabetes.  I do think she would benefit from further formalized physical therapy with gentle range of motion and traction related exercises, discussed with patient and daughter for her to return here but to let them know to treat her neck gently as she had worse pain in the initial visit.  Also recommended Biofreeze/IcyHot with heat to help calm down the muscles of the neck.  I will see her back in January when she returns from her trip to [place].  Additional treatment considerations: Facet based cervical injections, would likely need MRI prior to this for clearance with Dr. Eldonna  Follow-up: Return in about 6 weeks (around 07/20/2024) for neck f/u .   Meds & Orders:  Meds ordered this encounter  Medications   meloxicam  (MOBIC ) 15 MG tablet    Sig: Take 1 tablet (15 mg total) by mouth daily as needed for pain.    Dispense:  30 tablet    Refill:  1    Orders Placed This Encounter  Procedures   Trigger Point Inj     Procedures: Trigger Point Inj  Date/Time: 06/08/2024 8:38 AM  Performed by: Burnetta Brunet, DO Authorized by: Burnetta Brunet, DO   Consent Given by:  Patient Site marked: the procedure site was marked   Timeout: prior to procedure the correct patient, procedure, and site was verified   Indications:  Muscle spasm and pain Total # of Trigger Points:  2 Location: neck   Needle  Size:  25 G Approach:  Dorsal Medications #1:  0.5 mL lidocaine  1 %; 0.5 mL bupivacaine  0.25 %; 20 mg methylPREDNISolone acetate 40 MG/ML Medications #2:  0.5 mL lidocaine  1 %; 0.5 mL bupivacaine  0.25 % Patient tolerance:  Patient tolerated the procedure well with no immediate complications       Clinical History: No specialty comments available.  She reports that she has never smoked. She has never used smokeless tobacco.  Recent Labs    06/24/23 0838 12/09/23 0835  HGBA1C 7.3* 10.8*    Objective:    Physical Exam  Gen: Well-appearing, in  no acute distress; non-toxic CV: Well-perfused. Warm.  Resp: Breathing unlabored on room air; no wheezing. Psych: Fluid speech in conversation; appropriate affect; normal thought process  Ortho Exam - Cervical Spine/Trapezius: There is significant right sided paraspinal hypertonicity that extends down the paraspinal muscles and into the right trapezius musculature, associated trigger point in this location.  There is limited range of motion in all planes but worsening of pain with endrange flexion and right rotation.  Negative Spurling's test.  There is a degree of bilateral shoulder protraction.  Imaging:  4 view of the cervical spine x-rays from 05/23/2024 were independently reviewed and interpreted by myself today.  AP, oblique and lateral films were visualized.  There rather advanced degenerative disc disease with associated endplate arthritic changes most notable at the C5-C6 and C6-C7 location.  C7-T1 is incompletely visualized given overlying soft tissue.  There is mild facet arthritis but less advanced in this location.  No acute fracture noted.  Narrative & Impression  CLINICAL DATA:  Chronic neck pain   EXAM: DG CERVICAL SPINE COMPLETE 4+V   COMPARISON:  05/06/2016   FINDINGS: Normal alignment and prevertebral soft tissues. Progressive severe degenerative disc disease at C5-6 and C6-7 with marked disc space narrowing, endplate sclerosis and endplate bony spurring. Facets are aligned. C7 is obscured by overlying soft tissues. Odontoid grossly intact. Trachea midline. Clear lung apices.   IMPRESSION: Progressive severe degenerative disc disease at C5-6 and C6-7.     Electronically Signed   By: CHRISTELLA.  Shick M.D.   On: 05/27/2024 16:13    Past Medical/Family/Surgical/Social History: Medications & Allergies reviewed per EMR, new medications updated. Patient Active Problem List   Diagnosis Date Noted   Degenerative disc disease, cervical 05/28/2024   Voice hoarseness  01/28/2023   Chronic cough 01/28/2023   IDA (iron deficiency anemia) 12/31/2022   Essential hypertension 05/01/2019   Type 2 diabetes mellitus without complication, without long-term current use of insulin  (HCC) 04/03/2018   Vitamin D  deficiency 04/03/2018   High cholesterol 04/03/2018   Cholecystitis 03/23/2018   Epigastric abdominal pain 03/23/2018   Left leg pain 03/23/2018   Obesity (BMI 30-39.9) 03/23/2018   SIRS (systemic inflammatory response syndrome) (HCC) 03/23/2018   Past Medical History:  Diagnosis Date   Diabetes mellitus without complication (HCC)    High cholesterol    Hypertension    History reviewed. No pertinent family history. Past Surgical History:  Procedure Laterality Date   CHOLECYSTECTOMY N/A 03/23/2018   Procedure: LAPAROSCOPIC CHOLECYSTECTOMY WITH INTRAOPERATIVE CHOLANGIOGRAM;  Surgeon: Eletha Boas, MD;  Location: [facility];  Service: General;  Laterality: N/A;   Social History   Occupational History   Not on file  Tobacco Use   Smoking status: Never   Smokeless tobacco: Never  Substance and Sexual Activity   Alcohol use: Never   Drug use: Never   Sexual activity: Not on file

## 2024-06-15 ENCOUNTER — Encounter: Payer: Self-pay | Admitting: Internal Medicine

## 2024-06-15 ENCOUNTER — Ambulatory Visit: Admitting: Internal Medicine

## 2024-06-15 VITALS — BP 122/80 | HR 72 | Temp 98.0°F | Ht 63.0 in | Wt 190.0 lb

## 2024-06-15 DIAGNOSIS — E7800 Pure hypercholesterolemia, unspecified: Secondary | ICD-10-CM

## 2024-06-15 DIAGNOSIS — E559 Vitamin D deficiency, unspecified: Secondary | ICD-10-CM

## 2024-06-15 DIAGNOSIS — E119 Type 2 diabetes mellitus without complications: Secondary | ICD-10-CM | POA: Diagnosis not present

## 2024-06-15 DIAGNOSIS — I10 Essential (primary) hypertension: Secondary | ICD-10-CM

## 2024-06-15 DIAGNOSIS — Z23 Encounter for immunization: Secondary | ICD-10-CM | POA: Diagnosis not present

## 2024-06-15 DIAGNOSIS — Z7984 Long term (current) use of oral hypoglycemic drugs: Secondary | ICD-10-CM

## 2024-06-15 DIAGNOSIS — M5030 Other cervical disc degeneration, unspecified cervical region: Secondary | ICD-10-CM

## 2024-06-15 LAB — MICROALBUMIN / CREATININE URINE RATIO
Creatinine,U: 38.4 mg/dL
Microalb Creat Ratio: UNDETERMINED mg/g (ref 0.0–30.0)
Microalb, Ur: <0.7 mg/dL

## 2024-06-15 MED ORDER — HYDROCHLOROTHIAZIDE 25 MG PO TABS: 25.0000 mg | ORAL_TABLET | Freq: Every day | ORAL | 1 refills | Status: AC

## 2024-06-15 MED ORDER — ATORVASTATIN CALCIUM 10 MG PO TABS: 10.0000 mg | ORAL_TABLET | Freq: Every day | ORAL | 1 refills | Status: AC

## 2024-06-15 MED ORDER — METFORMIN HCL 1000 MG PO TABS: 1000.0000 mg | ORAL_TABLET | Freq: Two times a day (BID) | ORAL | 1 refills | Status: AC

## 2024-06-15 NOTE — Progress Notes (Signed)
 [facility] [address] [place] [address] Dept: [phone] Dept Fax: [phone]    Subjective:   Kathryn Christian 08/03/1956 06/15/2024  Chief Complaint  Patient presents with   Follow-up    Neck pain  is better, out of country 2 [place] refill    HPI: Kathryn Christian presents today for re-assessment and management of chronic medical conditions.  Discussed the use of AI scribe software for clinical note transcription with the patient, who gave verbal consent to proceed.  History of Present Illness   Kathryn Christian is a 68 year old female who presents for a routine follow-up visit.  She is managing multiple chronic conditions, including type 2 diabetes, essential hypertension, vitamin D  deficiency, high cholesterol, and degenerative disc disease of the cervical spine. She is preparing for international travel to [place] to visit family and requests a flu shot today.  Her neck pain has improved since her last visit, although she still experiences pain when turning. She has seen an orthopedist for this issue and recently received an injection for pain which has helped.  Her current medications include Jardiance  25 mg once daily and metformin  1000 mg twice daily for diabetes control, atorvastatin  10 mg once daily for high cholesterol, a weekly vitamin D  supplement for vitamin D  deficiency, and hydrochlorothiazide  25 mg once daily for hypertension. She also uses meloxicam  as needed for pain management.   The following portions of the patient's history were reviewed and updated as appropriate: past medical history, past surgical history, family history, social history, allergies, medications, and problem list.   Patient Active Problem List   Diagnosis Date Noted   Degenerative disc disease, cervical 05/28/2024   Voice hoarseness 01/28/2023   Chronic cough 01/28/2023   IDA (iron deficiency anemia) 12/31/2022   Essential  hypertension 05/01/2019   Type 2 diabetes mellitus without complication, without long-term current use of insulin  (HCC) 04/03/2018   Vitamin D  deficiency 04/03/2018   High cholesterol 04/03/2018   Cholecystitis 03/23/2018   Epigastric abdominal pain 03/23/2018   Left leg pain 03/23/2018   Obesity (BMI 30-39.9) 03/23/2018   SIRS (systemic inflammatory response syndrome) (HCC) 03/23/2018   Past Medical History:  Diagnosis Date   Diabetes mellitus without complication (HCC)    High cholesterol    Hypertension    Past Surgical History:  Procedure Laterality Date   CHOLECYSTECTOMY N/A 03/23/2018   Procedure: LAPAROSCOPIC CHOLECYSTECTOMY WITH INTRAOPERATIVE CHOLANGIOGRAM;  Surgeon: Eletha Boas, MD;  Location: [facility];  Service: General;  Laterality: N/A;   History reviewed. No pertinent family history.  Current Outpatient Medications:    acetaminophen  (TYLENOL ) 325 MG tablet, Take 2 tablets (650 mg total) by mouth every 6 (six) hours as needed for mild pain (or Fever >/= 101)., Disp: 20 tablet, Rfl: 0   cholecalciferol (VITAMIN D3) 25 MCG (1000 UNIT) tablet, TAKE 1 TABLET BY MOUTH EVERY DAY, Disp: 90 tablet, Rfl: 1   empagliflozin  (JARDIANCE ) 25 MG TABS tablet, Take 1 tablet (25 mg total) by mouth daily before breakfast., Disp: 90 tablet, Rfl: 1   Lancets MISC, Use to check blood sugars up to 2 times daily. Diagnosis code: E11.9, Disp: , Rfl:    [START ON 06/18/2024] meloxicam  (MOBIC ) 15 MG tablet, Take 1 tablet (15 mg total) by mouth daily as needed for pain., Disp: 30 tablet, Rfl: 1   Vitamin D , Ergocalciferol , (DRISDOL ) 1.25 MG (50000 UNIT) CAPS capsule, TAKE 1 CAPSULE (50,000 UNITS TOTAL) BY MOUTH EVERY 7 (SEVEN) DAYS, Disp:  12 capsule, Rfl: 1   albuterol  (VENTOLIN  HFA) 108 (90 Base) MCG/ACT inhaler, Inhale 2 puffs into the lungs every 6 (six) hours as needed for wheezing or shortness of breath. (Patient not taking: Reported on 12/09/2023), Disp: 18 g, Rfl: 2   atorvastatin  (LIPITOR) 10 MG  tablet, Take 1 tablet (10 mg total) by mouth daily., Disp: 90 tablet, Rfl: 1   hydrochlorothiazide  (HYDRODIURIL ) 25 MG tablet, Take 1 tablet (25 mg total) by mouth daily. Take 25 mg by mouth daily., Disp: 90 tablet, Rfl: 1   ipratropium-albuterol  (DUONEB) 0.5-2.5 (3) MG/3ML SOLN, Inhale into the lungs 2 (two) times daily. (Patient not taking: Reported on 06/15/2024), Disp: , Rfl:    metFORMIN  (GLUCOPHAGE ) 1000 MG tablet, Take 1 tablet (1,000 mg total) by mouth 2 (two) times daily., Disp: 180 tablet, Rfl: 1   methocarbamol  (ROBAXIN ) 500 MG tablet, Take 1 tablet (500 mg total) by mouth every 8 (eight) hours as needed. (Patient not taking: Reported on 06/15/2024), Disp: 30 tablet, Rfl: 0 No Known Allergies   ROS: A complete ROS was performed with pertinent positives/negatives noted in the HPI. The remainder of the ROS are negative.    Objective:   Today's Vitals   06/15/24 0826  BP: 122/80  Pulse: 72  Temp: 98 F (36.7 C)  TempSrc: Temporal  SpO2: 97%  Weight: 190 lb (86.2 kg)  Height: 5' 3 (1.6 m)    GENERAL: Well-appearing, in NAD. Well nourished.  SKIN: Pink, warm and dry.  NECK: Trachea midline. Full ROM w/o pain or tenderness. No lymphadenopathy.  RESPIRATORY: Chest wall symmetrical. Respirations even and non-labored. Breath sounds clear to auscultation bilaterally.  CARDIAC: S1, S2 present, regular rate and rhythm. Peripheral pulses 2+ bilaterally.  EXTREMITIES: Without clubbing, cyanosis, or edema.  NEUROLOGIC: No motor or sensory deficits. Steady, even gait. Sensory exam of the foot is normal, tested with the monofilament. Good pulses, no lesions or ulcers, good peripheral pulses. PSYCH/MENTAL STATUS: Alert, oriented x 3. Cooperative, appropriate mood and affect.   Health Maintenance Due  Topic Date Due   Diabetic kidney evaluation - Urine ACR  Never done   Zoster Vaccines- Shingrix (1 of 2) Never done   FOOT EXAM  03/31/2024   HEMOGLOBIN A1C  06/10/2024    No results  found for any visits on 06/15/24.  The 10-year ASCVD risk score (Arnett DK, et al., 2019) is: 16.8%     Assessment & Plan:  Assessment and Plan    Type 2 diabetes mellitus without complications Type 2 diabetes is managed with Jardiance  and metformin . Blood glucose control is monitored through A1c levels. - Ordered blood work to check A1c and kidney function. - Refilled metformin  prescription. - Microalbumin urine  Essential hypertension Blood pressure is well-controlled at 122/80 mmHg with hydrochlorothiazide . - Refilled hydrochlorothiazide  prescription.  Pure hypercholesterolemia Hypercholesterolemia is managed with atorvastatin . - Refilled atorvastatin  prescription.  Vitamin D  deficiency Managed with weekly supplementation. Vitamin D  levels were last checked in May and were slightly low. - Ordered blood work to check vitamin D  levels. - Refilled vitamin D  prescription.  Other cervical disc degeneration Cervical disc degeneration is improving with orthopedist's intervention. Pain has decreased, especially during neck movements. - Continue follow-up with orthopedist.  General Health Maintenance Routine health maintenance includes flu vaccination and compression socks for long flights. Mammogram, colonoscopy, and bone density scan were discussed but declined. - Administered flu shot. - Recommended compression socks for long flights.      Orders Placed This Encounter  Procedures  Flu vaccine HIGH DOSE PF(Fluzone Trivalent)   Microalbumin / creatinine urine ratio   Hemoglobin A1C   Basic Metabolic Panel (BMET)   VITAMIN D  25 Hydroxy (Vit-D Deficiency, Fractures)   CBC with Differential/Platelet   No images are attached to the encounter or orders placed in the encounter. Meds ordered this encounter  Medications   metFORMIN  (GLUCOPHAGE ) 1000 MG tablet    Sig: Take 1 tablet (1,000 mg total) by mouth 2 (two) times daily.    Dispense:  180 tablet    Refill:  1     Supervising Provider:   THOMPSON, AARON B [[id number]]   hydrochlorothiazide  (HYDRODIURIL ) 25 MG tablet    Sig: Take 1 tablet (25 mg total) by mouth daily. Take 25 mg by mouth daily.    Dispense:  90 tablet    Refill:  1    Supervising Provider:   THOMPSON, AARON B [[id number]]   atorvastatin  (LIPITOR) 10 MG tablet    Sig: Take 1 tablet (10 mg total) by mouth daily.    Dispense:  90 tablet    Refill:  1    Supervising Provider:   SEBASTIAN BEVERLEY NOVAK [[id number]]    Return in about 6 months (around 12/13/2024) for Chronic Condition follow up.   Rosina Senters, FNP

## 2024-06-18 ENCOUNTER — Ambulatory Visit: Payer: Self-pay | Admitting: Internal Medicine

## 2024-06-29 NOTE — Telephone Encounter (Signed)
 Unable to reach pt by phone also call daughter who is on dpr kishwar Harkey to let patient know lab results.

## 2024-08-10 ENCOUNTER — Ambulatory Visit: Admitting: Sports Medicine

## 2024-12-06 ENCOUNTER — Ambulatory Visit: Admitting: Internal Medicine

## 2024-12-14 ENCOUNTER — Ambulatory Visit: Admitting: Internal Medicine
